# Patient Record
Sex: Female | Born: 1950 | Race: Black or African American | Hispanic: No | Smoking: Former smoker
Health system: Southern US, Community
[De-identification: ages and names within clinical notes are randomized; demographics above are authoritative.]

## PROBLEM LIST (undated history)

## (undated) DIAGNOSIS — R7303 Prediabetes: Secondary | ICD-10-CM

## (undated) DIAGNOSIS — I10 Essential (primary) hypertension: Secondary | ICD-10-CM

## (undated) DIAGNOSIS — E559 Vitamin D deficiency, unspecified: Secondary | ICD-10-CM

## (undated) DIAGNOSIS — E7800 Pure hypercholesterolemia, unspecified: Secondary | ICD-10-CM

## (undated) DIAGNOSIS — F419 Anxiety disorder, unspecified: Secondary | ICD-10-CM

## (undated) DIAGNOSIS — M7989 Other specified soft tissue disorders: Secondary | ICD-10-CM

## (undated) DIAGNOSIS — M2550 Pain in unspecified joint: Secondary | ICD-10-CM

## (undated) DIAGNOSIS — M1990 Unspecified osteoarthritis, unspecified site: Secondary | ICD-10-CM

## (undated) DIAGNOSIS — F32A Depression, unspecified: Secondary | ICD-10-CM

## (undated) HISTORY — PX: BREAST REDUCTION SURGERY: SHX8

## (undated) HISTORY — DX: Prediabetes: R73.03

## (undated) HISTORY — PX: TONSILLECTOMY: SUR1361

## (undated) HISTORY — DX: Pain in unspecified joint: M25.50

## (undated) HISTORY — DX: Depression, unspecified: F32.A

## (undated) HISTORY — DX: Vitamin D deficiency, unspecified: E55.9

## (undated) HISTORY — DX: Other specified soft tissue disorders: M79.89

## (undated) HISTORY — DX: Anxiety disorder, unspecified: F41.9

## (undated) HISTORY — DX: Pure hypercholesterolemia, unspecified: E78.00

---

## 2003-01-22 ENCOUNTER — Other Ambulatory Visit: Admission: RE | Admit: 2003-01-22 | Discharge: 2003-01-22 | Payer: Self-pay | Admitting: *Deleted

## 2003-01-29 ENCOUNTER — Encounter: Admission: RE | Admit: 2003-01-29 | Discharge: 2003-04-29 | Payer: Self-pay | Admitting: Family Medicine

## 2003-02-04 ENCOUNTER — Encounter: Payer: Self-pay | Admitting: Obstetrics and Gynecology

## 2003-02-04 ENCOUNTER — Ambulatory Visit (HOSPITAL_COMMUNITY): Admission: RE | Admit: 2003-02-04 | Discharge: 2003-02-04 | Payer: Self-pay | Admitting: Obstetrics and Gynecology

## 2005-02-24 ENCOUNTER — Emergency Department (HOSPITAL_COMMUNITY): Admission: EM | Admit: 2005-02-24 | Discharge: 2005-02-24 | Payer: Self-pay | Admitting: Emergency Medicine

## 2005-03-22 ENCOUNTER — Other Ambulatory Visit: Admission: RE | Admit: 2005-03-22 | Discharge: 2005-03-22 | Payer: Self-pay | Admitting: Family Medicine

## 2005-04-13 ENCOUNTER — Encounter: Admission: RE | Admit: 2005-04-13 | Discharge: 2005-04-13 | Payer: Self-pay | Admitting: Family Medicine

## 2006-03-27 ENCOUNTER — Other Ambulatory Visit: Admission: RE | Admit: 2006-03-27 | Discharge: 2006-03-27 | Payer: Self-pay | Admitting: Family Medicine

## 2007-07-24 HISTORY — PX: REDUCTION MAMMAPLASTY: SUR839

## 2008-03-10 ENCOUNTER — Ambulatory Visit (HOSPITAL_COMMUNITY): Admission: RE | Admit: 2008-03-10 | Discharge: 2008-03-10 | Payer: Self-pay | Admitting: Internal Medicine

## 2014-01-14 ENCOUNTER — Ambulatory Visit (HOSPITAL_COMMUNITY)
Admission: RE | Admit: 2014-01-14 | Discharge: 2014-01-14 | Disposition: A | Discharge status: Home / Self Care | Payer: BC Managed Care – PPO | Source: Ambulatory Visit | Attending: Anesthesiology | Admitting: Anesthesiology

## 2014-01-14 ENCOUNTER — Encounter (HOSPITAL_COMMUNITY): Payer: Self-pay | Admitting: Pharmacy Technician

## 2014-01-14 ENCOUNTER — Encounter (HOSPITAL_COMMUNITY): Payer: Self-pay

## 2014-01-14 ENCOUNTER — Encounter (HOSPITAL_COMMUNITY)
Admission: RE | Admit: 2014-01-14 | Discharge: 2014-01-14 | Disposition: A | Discharge status: Home / Self Care | Payer: BC Managed Care – PPO | Source: Ambulatory Visit | Attending: Orthopedic Surgery | Admitting: Orthopedic Surgery

## 2014-01-14 DIAGNOSIS — M1610 Unilateral primary osteoarthritis, unspecified hip: Secondary | ICD-10-CM | POA: Diagnosis not present

## 2014-01-14 DIAGNOSIS — Z01810 Encounter for preprocedural cardiovascular examination: Secondary | ICD-10-CM | POA: Diagnosis not present

## 2014-01-14 DIAGNOSIS — M169 Osteoarthritis of hip, unspecified: Secondary | ICD-10-CM | POA: Diagnosis not present

## 2014-01-14 DIAGNOSIS — Z01818 Encounter for other preprocedural examination: Secondary | ICD-10-CM | POA: Diagnosis present

## 2014-01-14 DIAGNOSIS — Z01812 Encounter for preprocedural laboratory examination: Secondary | ICD-10-CM | POA: Insufficient documentation

## 2014-01-14 HISTORY — DX: Essential (primary) hypertension: I10

## 2014-01-14 HISTORY — DX: Unspecified osteoarthritis, unspecified site: M19.90

## 2014-01-14 LAB — BASIC METABOLIC PANEL
BUN: 17 mg/dL (ref 6–23)
CALCIUM: 9.5 mg/dL (ref 8.4–10.5)
CO2: 26 mEq/L (ref 19–32)
Chloride: 99 mEq/L (ref 96–112)
Creatinine, Ser: 0.7 mg/dL (ref 0.50–1.10)
GLUCOSE: 97 mg/dL (ref 70–99)
POTASSIUM: 3.7 meq/L (ref 3.7–5.3)
SODIUM: 137 meq/L (ref 137–147)

## 2014-01-14 LAB — CBC
HCT: 39.4 % (ref 36.0–46.0)
Hemoglobin: 12.5 g/dL (ref 12.0–15.0)
MCH: 27.8 pg (ref 26.0–34.0)
MCHC: 31.7 g/dL (ref 30.0–36.0)
MCV: 87.8 fL (ref 78.0–100.0)
PLATELETS: 323 10*3/uL (ref 150–400)
RBC: 4.49 MIL/uL (ref 3.87–5.11)
RDW: 14 % (ref 11.5–15.5)
WBC: 5.5 10*3/uL (ref 4.0–10.5)

## 2014-01-14 LAB — URINALYSIS, ROUTINE W REFLEX MICROSCOPIC
Bilirubin Urine: NEGATIVE
Glucose, UA: NEGATIVE mg/dL
HGB URINE DIPSTICK: NEGATIVE
KETONES UR: NEGATIVE mg/dL
Leukocytes, UA: NEGATIVE
Nitrite: NEGATIVE
Protein, ur: NEGATIVE mg/dL
SPECIFIC GRAVITY, URINE: 1.011 (ref 1.005–1.030)
Urobilinogen, UA: 0.2 mg/dL (ref 0.0–1.0)
pH: 5 (ref 5.0–8.0)

## 2014-01-14 LAB — SURGICAL PCR SCREEN
MRSA, PCR: INVALID — AB
Staphylococcus aureus: INVALID — AB

## 2014-01-14 LAB — PROTIME-INR
INR: 0.99 (ref 0.00–1.49)
Prothrombin Time: 13.1 seconds (ref 11.6–15.2)

## 2014-01-14 LAB — TYPE AND SCREEN
ABO/RH(D): A POS
ANTIBODY SCREEN: NEGATIVE

## 2014-01-14 LAB — APTT: APTT: 36 s (ref 24–37)

## 2014-01-14 LAB — ABO/RH: ABO/RH(D): A POS

## 2014-01-14 NOTE — Pre-Procedure Instructions (Signed)
EKG AND CXR WERE DONE TODAY - PREOP AT [facility]. 

## 2014-01-14 NOTE — Patient Instructions (Addendum)
YOUR SURGERY IS SCHEDULED AT [facility]  ON:  Monday 6/29  REPORT TO  SHORT STAY CENTER AT:  1:35 PM   PLEASE COME IN THE [facility] MAIN HOSPITAL ENTRANCE AND FOLLOW SIGNS TO SHORT STAY CENTER.  DO NOT EAT  ANYTHING AFTER MIDNIGHT THE NIGHT BEFORE YOUR SURGERY.  NO FOOD, NO CHEWING GUM, NO MINTS, NO CANDIES, NO CHEWING TOBACCO.  YOU MAY HAVE CLEAR LIQUIDS TO DRINK FROM MIDNIGHT UNTIL 10:00 AM DAY OF YOUR SURGERY - LIKE WATER,  CRYSTAL LITE,   GINGER ALE.                                NOTHING TO DRINK AFTER 10:00 AM DAY OF SURGERY.  PLEASE TAKE THE FOLLOWING MEDICATIONS THE AM OF YOUR SURGERY WITH A FEW SIPS OF WATER:  NO MEDICATIONS TO TAKE.    DO NOT BRING VALUABLES, MONEY, CREDIT CARDS.  DO NOT WEAR JEWELRY, MAKE-UP, NAIL POLISH AND NO METAL PINS OR CLIPS IN YOUR HAIR. CONTACT LENS, DENTURES / PARTIALS, GLASSES SHOULD NOT BE WORN TO SURGERY AND IN MOST CASES-HEARING AIDS WILL NEED TO BE REMOVED.  BRING YOUR GLASSES CASE, ANY EQUIPMENT NEEDED FOR YOUR CONTACT LENS. FOR PATIENTS ADMITTED TO THE HOSPITAL--CHECK OUT TIME THE DAY OF DISCHARGE IS 11:00 AM.  ALL INPATIENT ROOMS ARE PRIVATE - WITH BATHROOM, TELEPHONE, TELEVISION AND WIFI INTERNET.                                                    PLEASE READ OVER ANY  FACT SHEETS THAT YOU WERE GIVEN: MRSA INFORMATION, BLOOD TRANSFUSION INFORMATION, INCENTIVE SPIROMETER INFORMATION.  PLEASE BE AWARE THAT YOU MAY NEED ADDITIONAL BLOOD DRAWN DAY OF YOUR SURGERY  _______________________________________________________________________   [facility] - Preparing for Surgery Before surgery, you can play an important role.  Because skin is not sterile, your skin needs to be as free of germs as possible.  You can reduce the number of germs on your skin by washing with CHG (chlorahexidine gluconate) soap before surgery.  CHG is an antiseptic cleaner which kills germs and bonds with the skin to continue killing germs even after  washing. Please DO NOT use if you have an allergy to CHG or antibacterial soaps.  If your skin becomes reddened/irritated stop using the CHG and inform your nurse when you arrive at Short Stay. Do not shave (including legs and underarms) for at least 48 hours prior to the first CHG shower.  You may shave your face/neck. Please follow these instructions carefully:  1.  Shower with CHG Soap the night before surgery and the  morning of Surgery.  2.  If you choose to wash your hair, wash your hair first as usual with your  normal  shampoo.  3.  After you shampoo, rinse your hair and body thoroughly to remove the  shampoo.                           4.  Use CHG as you would any other liquid soap.  You can apply chg directly  to the skin and wash                       Gently  with a scrungie or clean washcloth.  5.  Apply the CHG Soap to your body ONLY FROM THE NECK DOWN.   Do not use on face/ open                           Wound or open sores. Avoid contact with eyes, ears mouth and genitals (private parts).                       Wash face,  Genitals (private parts) with your normal soap.             6.  Wash thoroughly, paying special attention to the area where your surgery  will be performed.  7.  Thoroughly rinse your body with warm water from the neck down.  8.  DO NOT shower/wash with your normal soap after using and rinsing off  the CHG Soap.                9.  Pat yourself dry with a clean towel.            10.  Wear clean pajamas.            11.  Place clean sheets on your bed the night of your first shower and do not  sleep with pets. Day of Surgery : Do not apply any lotions/deodorants the morning of surgery.  Please wear clean clothes to the hospital/surgery center.  FAILURE TO FOLLOW THESE INSTRUCTIONS MAY RESULT IN THE CANCELLATION OF YOUR SURGERY PATIENT SIGNATURE_________________________________  NURSE  SIGNATURE__________________________________  ________________________________________________________________________   Rogelia MireIncentive Spirometer  An incentive spirometer is a tool that can help keep your lungs clear and active. This tool measures how well you are filling your lungs with each breath. Taking long deep breaths may help reverse or decrease the chance of developing breathing (pulmonary) problems (especially infection) following:  A long period of time when you are unable to move or be active. BEFORE THE PROCEDURE   If the spirometer includes an indicator to show your best effort, your nurse or respiratory therapist will set it to a desired goal.  If possible, sit up straight or lean slightly forward. Try not to slouch.  Hold the incentive spirometer in an upright position. INSTRUCTIONS FOR USE  1. Sit on the edge of your bed if possible, or sit up as far as you can in bed or on a chair. 2. Hold the incentive spirometer in an upright position. 3. Breathe out normally. 4. Place the mouthpiece in your mouth and seal your lips tightly around it. 5. Breathe in slowly and as deeply as possible, raising the piston or the ball toward the top of the column. 6. Hold your breath for 3-5 seconds or for as long as possible. Allow the piston or ball to fall to the bottom of the column. 7. Remove the mouthpiece from your mouth and breathe out normally. 8. Rest for a few seconds and repeat Steps 1 through 7 at least 10 times every 1-2 hours when you are awake. Take your time and take a few normal breaths between deep breaths. 9. The spirometer may include an indicator to show your best effort. Use the indicator as a goal to work toward during each repetition. 10. After each set of 10 deep breaths, practice coughing to be sure your lungs are clear. If you have an incision (the cut made at the time of surgery), support  your incision when coughing by placing a pillow or rolled up towels firmly  against it. Once you are able to get out of bed, walk around indoors and cough well. You may stop using the incentive spirometer when instructed by your caregiver.  RISKS AND COMPLICATIONS  Take your time so you do not get dizzy or light-headed.  If you are in pain, you may need to take or ask for pain medication before doing incentive spirometry. It is harder to take a deep breath if you are having pain. AFTER USE  Rest and breathe slowly and easily.  It can be helpful to keep track of a log of your progress. Your caregiver can provide you with a simple table to help with this. If you are using the spirometer at home, follow these instructions: SEEK MEDICAL CARE IF:   You are having difficultly using the spirometer.  You have trouble using the spirometer as often as instructed.  Your pain medication is not giving enough relief while using the spirometer.  You develop fever of 100.5 F (38.1 C) or higher. SEEK IMMEDIATE MEDICAL CARE IF:   You cough up bloody sputum that had not been present before.  You develop fever of 102 F (38.9 C) or greater.  You develop worsening pain at or near the incision site. MAKE SURE YOU:   Understand these instructions.  Will watch your condition.  Will get help right away if you are not doing well or get worse. Document Released: 11/19/2006 Document Revised: 10/01/2011 Document Reviewed: 01/20/2007 Exit[employer] Patient Information 2014 [employer], [place].   ________________________________________________________________________  WHAT IS A BLOOD TRANSFUSION? Blood Transfusion Information  A transfusion is the replacement of blood or some of its parts. Blood is made up of multiple cells which provide different functions.  Red blood cells carry oxygen and are used for blood loss replacement.  White blood cells fight against infection.  Platelets control bleeding.  Plasma helps clot blood.  Other blood products are available for  specialized needs, such as hemophilia or other clotting disorders. BEFORE THE TRANSFUSION  Who gives blood for transfusions?   Healthy volunteers who are fully evaluated to make sure their blood is safe. This is blood bank blood. Transfusion therapy is the safest it has ever been in the practice of medicine. Before blood is taken from a donor, a complete history is taken to make sure that person has no history of diseases nor engages in risky social behavior (examples are intravenous drug use or sexual activity with multiple partners). The donor's travel history is screened to minimize risk of transmitting infections, such as malaria. The donated blood is tested for signs of infectious diseases, such as HIV and hepatitis. The blood is then tested to be sure it is compatible with you in order to minimize the chance of a transfusion reaction. If you or a relative donates blood, this is often done in anticipation of surgery and is not appropriate for emergency situations. It takes many days to process the donated blood. RISKS AND COMPLICATIONS Although transfusion therapy is very safe and saves many lives, the main dangers of transfusion include:   Getting an infectious disease.  Developing a transfusion reaction. This is an allergic reaction to something in the blood you were given. Every precaution is taken to prevent this. The decision to have a blood transfusion has been considered carefully by your caregiver before blood is given. Blood is not given unless the benefits outweigh the risks. AFTER THE TRANSFUSION  Right after receiving a blood transfusion, you will usually feel much better and more energetic. This is especially true if your red blood cells have gotten low (anemic). The transfusion raises the level of the red blood cells which carry oxygen, and this usually causes an energy increase.  The nurse administering the transfusion will monitor you carefully for complications. HOME CARE  INSTRUCTIONS  No special instructions are needed after a transfusion. You may find your energy is better. Speak with your caregiver about any limitations on activity for underlying diseases you may have. SEEK MEDICAL CARE IF:   Your condition is not improving after your transfusion.  You develop redness or irritation at the intravenous (IV) site. SEEK IMMEDIATE MEDICAL CARE IF:  Any of the following symptoms occur over the next 12 hours:  Shaking chills.  You have a temperature by mouth above 102 F (38.9 C), not controlled by medicine.  Chest, back, or muscle pain.  People around you feel you are not acting correctly or are confused.  Shortness of breath or difficulty breathing.  Dizziness and fainting.  You get a rash or develop hives.  You have a decrease in urine output.  Your urine turns a dark color or changes to pink, red, or brown. Any of the following symptoms occur over the next 10 days:  You have a temperature by mouth above 102 F (38.9 C), not controlled by medicine.  Shortness of breath.  Weakness after normal activity.  The white part of the eye turns yellow (jaundice).  You have a decrease in the amount of urine or are urinating less often.  Your urine turns a dark color or changes to pink, red, or brown. Document Released: 07/06/2000 Document Revised: 10/01/2011 Document Reviewed: 02/23/2008 [facility] Patient Information 2014 [place], [place].  _______________________________________________________________________

## 2014-01-16 LAB — MRSA CULTURE

## 2014-01-16 NOTE — H&P (Signed)
TOTAL KNEE ADMISSION H&P  Patient is being admitted for left total knee arthroplasty.  Subjective:  Chief Complaint:     Left knee OA / pain.  HPI: Sherry Gutierrez, 63 y.o. female, has a history of pain and functional disability in the left knee due to arthritis and has failed non-surgical conservative treatments for greater than 12 weeks to include NSAID's and/or analgesics and activity modification.  Onset of symptoms was gradual, starting 3+ years ago with gradually worsening course since that time. The patient noted no past surgery on the left knee(s).  Patient currently rates pain in the left knee(s) at 10 out of 10 with activity. Patient has worsening of pain with activity and weight bearing, pain that interferes with activities of daily living, pain with passive range of motion, crepitus and joint swelling.  Patient has evidence of periarticular osteophytes and joint space narrowing by imaging studies.  There is no active infection.  Risks, benefits and expectations were discussed with the patient.  Risks including but not limited to the risk of anesthesia, blood clots, nerve damage, blood vessel damage, failure of the prosthesis, infection and up to and including death.  Patient understand the risks, benefits and expectations and wishes to proceed with surgery.   PCP: No primary Rayvin Abid on file.  D/C Plans:      Home with HHPT  Post-op Meds:       No Rx given  Tranexamic Acid:      Is to be given  Decadron:      Is to be given  FYI:     ASA post-op  Norco post-op    Past Medical History  Diagnosis Date  . Hypertension   . Arthritis     OA LEFT KNEE    Past Surgical History  Procedure Laterality Date  . Tonsillectomy      AGE 9    No prescriptions prior to admission   No Known Allergies   History  Substance Use Topics  . Smoking status: Former Games developermoker  . Smokeless tobacco: Never Used  . Alcohol Use: Yes     Comment: QUIT SMOKING 1982  OCCAS GLASS OF WINE    No  family history on file.   Review of Systems  Constitutional: Negative.   HENT: Negative.   Eyes: Negative.   Respiratory: Negative.   Cardiovascular: Negative.   Gastrointestinal: Negative.   Genitourinary: Negative.   Musculoskeletal: Positive for joint pain.  Skin: Negative.   Neurological: Negative.   Endo/Heme/Allergies: Negative.   Psychiatric/Behavioral: Negative.     Objective:  Physical Exam  Constitutional: She is oriented to person, place, and time. She appears well-developed and well-nourished.  HENT:  Head: Normocephalic and atraumatic.  Mouth/Throat: Oropharynx is clear and moist.  Eyes: Pupils are equal, round, and reactive to light.  Neck: Neck supple. No JVD present. No tracheal deviation present. No thyromegaly present.  Cardiovascular: Normal rate, regular rhythm, normal heart sounds and intact distal pulses.   Respiratory: Effort normal and breath sounds normal. No respiratory distress. She has no wheezes.  GI: Soft. There is no tenderness. There is no guarding.  Musculoskeletal:       Left knee: She exhibits decreased range of motion, swelling and bony tenderness. She exhibits no ecchymosis, no deformity, no laceration and no erythema. Tenderness found.  Lymphadenopathy:    She has no cervical adenopathy.  Neurological: She is alert and oriented to person, place, and time.  Skin: Skin is warm and dry.  Psychiatric:  She has a normal mood and affect.     Imaging Review Plain radiographs demonstrate severe degenerative joint disease of the left knee(s). The overall alignment is neutral. The bone quality appears to be good for age and reported activity level.  Assessment/Plan:  End stage arthritis, left knee   The patient history, physical examination, clinical judgment of the Shakena Callari and imaging studies are consistent with end stage degenerative joint disease of the left knee(s) and total knee arthroplasty is deemed medically necessary. The treatment  options including medical management, injection therapy arthroscopy and arthroplasty were discussed at length. The risks and benefits of total knee arthroplasty were presented and reviewed. The risks due to aseptic loosening, infection, stiffness, patella tracking problems, thromboembolic complications and other imponderables were discussed. The patient acknowledged the explanation, agreed to proceed with the plan and consent was signed. Patient is being admitted for inpatient treatment for surgery, pain control, PT, OT, prophylactic antibiotics, VTE prophylaxis, progressive ambulation and ADL's and discharge planning. The patient is planning to be discharged home with home health services.      Anastasio AuerbachMatthew S. Babish   PA-C  01/16/2014, 6:44 PM

## 2014-01-18 ENCOUNTER — Encounter (HOSPITAL_COMMUNITY)
Admission: RE | Disposition: A | Discharge status: Home Health | Payer: Self-pay | Source: Ambulatory Visit | Attending: Orthopedic Surgery

## 2014-01-18 ENCOUNTER — Inpatient Hospital Stay (HOSPITAL_COMMUNITY): Payer: BC Managed Care – PPO | Admitting: Anesthesiology

## 2014-01-18 ENCOUNTER — Inpatient Hospital Stay (HOSPITAL_COMMUNITY)
Admission: RE | Admit: 2014-01-18 | Discharge: 2014-01-21 | DRG: 470 | Disposition: A | Discharge status: Home Health | Payer: BC Managed Care – PPO | Source: Ambulatory Visit | Attending: Orthopedic Surgery | Admitting: Orthopedic Surgery

## 2014-01-18 ENCOUNTER — Encounter (HOSPITAL_COMMUNITY): Payer: BC Managed Care – PPO | Admitting: Anesthesiology

## 2014-01-18 ENCOUNTER — Encounter (HOSPITAL_COMMUNITY): Payer: Self-pay | Admitting: *Deleted

## 2014-01-18 DIAGNOSIS — Z6838 Body mass index (BMI) 38.0-38.9, adult: Secondary | ICD-10-CM

## 2014-01-18 DIAGNOSIS — D500 Iron deficiency anemia secondary to blood loss (chronic): Secondary | ICD-10-CM | POA: Diagnosis not present

## 2014-01-18 DIAGNOSIS — E669 Obesity, unspecified: Secondary | ICD-10-CM | POA: Diagnosis present

## 2014-01-18 DIAGNOSIS — Z96652 Presence of left artificial knee joint: Secondary | ICD-10-CM

## 2014-01-18 DIAGNOSIS — I10 Essential (primary) hypertension: Secondary | ICD-10-CM | POA: Diagnosis present

## 2014-01-18 DIAGNOSIS — Z87891 Personal history of nicotine dependence: Secondary | ICD-10-CM

## 2014-01-18 DIAGNOSIS — M6580 Other synovitis and tenosynovitis, unspecified site: Secondary | ICD-10-CM | POA: Diagnosis present

## 2014-01-18 DIAGNOSIS — M1710 Unilateral primary osteoarthritis, unspecified knee: Principal | ICD-10-CM | POA: Diagnosis present

## 2014-01-18 DIAGNOSIS — Z96659 Presence of unspecified artificial knee joint: Secondary | ICD-10-CM

## 2014-01-18 DIAGNOSIS — D62 Acute posthemorrhagic anemia: Secondary | ICD-10-CM | POA: Diagnosis not present

## 2014-01-18 HISTORY — PX: TOTAL KNEE ARTHROPLASTY: SHX125

## 2014-01-18 SURGERY — ARTHROPLASTY, KNEE, TOTAL
Anesthesia: Spinal | Site: Knee | Laterality: Left

## 2014-01-18 MED ORDER — EPHEDRINE SULFATE 50 MG/ML IJ SOLN
INTRAMUSCULAR | Status: DC | PRN
  Administered 2014-01-18 (×2): 7.5 mg via INTRAVENOUS

## 2014-01-18 MED ORDER — MIDAZOLAM HCL 5 MG/5ML IJ SOLN
INTRAMUSCULAR | Status: DC | PRN
  Administered 2014-01-18: 2 mg via INTRAVENOUS

## 2014-01-18 MED ORDER — ASPIRIN EC 325 MG PO TBEC
325.0000 mg | DELAYED_RELEASE_TABLET | Freq: Two times a day (BID) | ORAL | Status: DC
  Administered 2014-01-19 – 2014-01-21 (×5): 325 mg via ORAL
  Filled 2014-01-18 (×7): qty 1

## 2014-01-18 MED ORDER — POLYETHYLENE GLYCOL 3350 17 G PO PACK
17.0000 g | PACK | Freq: Two times a day (BID) | ORAL | Status: DC
  Administered 2014-01-18 – 2014-01-21 (×3): 17 g via ORAL

## 2014-01-18 MED ORDER — MIDAZOLAM HCL 2 MG/2ML IJ SOLN
INTRAMUSCULAR | Status: AC
  Filled 2014-01-18: qty 2

## 2014-01-18 MED ORDER — HYDROMORPHONE HCL PF 1 MG/ML IJ SOLN: 0.2500 mg | INTRAMUSCULAR | Status: DC | PRN

## 2014-01-18 MED ORDER — CEFAZOLIN SODIUM-DEXTROSE 2-3 GM-% IV SOLR
2.0000 g | INTRAVENOUS | Status: AC
  Administered 2014-01-18: 2 g via INTRAVENOUS

## 2014-01-18 MED ORDER — BISACODYL 10 MG RE SUPP: 10.0000 mg | Freq: Every day | RECTAL | Status: DC | PRN

## 2014-01-18 MED ORDER — LACTATED RINGERS IV SOLN
INTRAVENOUS | Status: DC
  Administered 2014-01-18: 17:00:00 via INTRAVENOUS
  Administered 2014-01-18: 1000 mL via INTRAVENOUS

## 2014-01-18 MED ORDER — METHOCARBAMOL 1000 MG/10ML IJ SOLN
500.0000 mg | Freq: Four times a day (QID) | INTRAVENOUS | Status: DC | PRN
  Filled 2014-01-18: qty 5

## 2014-01-18 MED ORDER — METOCLOPRAMIDE HCL 5 MG/ML IJ SOLN: 5.0000 mg | Freq: Three times a day (TID) | INTRAMUSCULAR | Status: DC | PRN

## 2014-01-18 MED ORDER — CEFAZOLIN SODIUM-DEXTROSE 2-3 GM-% IV SOLR
INTRAVENOUS | Status: AC
  Filled 2014-01-18: qty 50

## 2014-01-18 MED ORDER — ONDANSETRON HCL 4 MG/2ML IJ SOLN
4.0000 mg | Freq: Four times a day (QID) | INTRAMUSCULAR | Status: DC | PRN
  Administered 2014-01-19: 4 mg via INTRAVENOUS
  Filled 2014-01-18: qty 2

## 2014-01-18 MED ORDER — METHOCARBAMOL 500 MG PO TABS
500.0000 mg | ORAL_TABLET | Freq: Four times a day (QID) | ORAL | Status: DC | PRN
  Filled 2014-01-18: qty 1

## 2014-01-18 MED ORDER — FERROUS SULFATE 325 (65 FE) MG PO TABS
325.0000 mg | ORAL_TABLET | Freq: Three times a day (TID) | ORAL | Status: DC
  Administered 2014-01-19 – 2014-01-21 (×4): 325 mg via ORAL
  Filled 2014-01-18 (×10): qty 1

## 2014-01-18 MED ORDER — HYDROCODONE-ACETAMINOPHEN 7.5-325 MG PO TABS
1.0000 | ORAL_TABLET | ORAL | Status: DC
  Administered 2014-01-18: 1 via ORAL
  Administered 2014-01-19 (×2): 2 via ORAL
  Administered 2014-01-19: 1 via ORAL
  Administered 2014-01-19 – 2014-01-21 (×8): 2 via ORAL
  Filled 2014-01-18: qty 1
  Filled 2014-01-18: qty 2
  Filled 2014-01-18: qty 1
  Filled 2014-01-18 (×3): qty 2
  Filled 2014-01-18: qty 1
  Filled 2014-01-18 (×4): qty 2
  Filled 2014-01-18: qty 1
  Filled 2014-01-18: qty 2
  Filled 2014-01-18: qty 1

## 2014-01-18 MED ORDER — BUPIVACAINE LIPOSOME 1.3 % IJ SUSP
20.0000 mL | Freq: Once | INTRAMUSCULAR | Status: AC
  Administered 2014-01-18: 20 mL
  Filled 2014-01-18: qty 20

## 2014-01-18 MED ORDER — MENTHOL 3 MG MT LOZG
1.0000 | LOZENGE | OROMUCOSAL | Status: DC | PRN
  Filled 2014-01-18: qty 9

## 2014-01-18 MED ORDER — HYDROMORPHONE HCL PF 1 MG/ML IJ SOLN
0.5000 mg | INTRAMUSCULAR | Status: DC | PRN
  Administered 2014-01-18: 0.5 mg via INTRAVENOUS
  Filled 2014-01-18: qty 1

## 2014-01-18 MED ORDER — MAGNESIUM CITRATE PO SOLN: 1.0000 | Freq: Once | ORAL | Status: AC | PRN

## 2014-01-18 MED ORDER — CELECOXIB 200 MG PO CAPS
200.0000 mg | ORAL_CAPSULE | Freq: Two times a day (BID) | ORAL | Status: DC
  Administered 2014-01-18 – 2014-01-21 (×6): 200 mg via ORAL
  Filled 2014-01-18 (×7): qty 1

## 2014-01-18 MED ORDER — KETOROLAC TROMETHAMINE 30 MG/ML IJ SOLN
INTRAMUSCULAR | Status: DC | PRN
  Administered 2014-01-18: 30 mg via INTRAVENOUS

## 2014-01-18 MED ORDER — ONDANSETRON HCL 4 MG PO TABS
4.0000 mg | ORAL_TABLET | Freq: Four times a day (QID) | ORAL | Status: DC | PRN
  Administered 2014-01-19: 4 mg via ORAL
  Filled 2014-01-18: qty 1

## 2014-01-18 MED ORDER — LACTATED RINGERS IV SOLN: INTRAVENOUS | Status: DC

## 2014-01-18 MED ORDER — TRANEXAMIC ACID 100 MG/ML IV SOLN
1000.0000 mg | Freq: Once | INTRAVENOUS | Status: AC
  Administered 2014-01-18: 1000 mg via INTRAVENOUS
  Filled 2014-01-18: qty 10

## 2014-01-18 MED ORDER — DEXAMETHASONE SODIUM PHOSPHATE 10 MG/ML IJ SOLN
INTRAMUSCULAR | Status: AC
  Filled 2014-01-18: qty 1

## 2014-01-18 MED ORDER — FENTANYL CITRATE 0.05 MG/ML IJ SOLN
INTRAMUSCULAR | Status: DC | PRN
  Administered 2014-01-18 (×2): 50 ug via INTRAVENOUS

## 2014-01-18 MED ORDER — BUPIVACAINE-EPINEPHRINE (PF) 0.25% -1:200000 IJ SOLN
INTRAMUSCULAR | Status: DC | PRN
  Administered 2014-01-18: 30 mL via PERINEURAL

## 2014-01-18 MED ORDER — PROPOFOL 10 MG/ML IV BOLUS
INTRAVENOUS | Status: AC
  Filled 2014-01-18: qty 20

## 2014-01-18 MED ORDER — KETOROLAC TROMETHAMINE 30 MG/ML IJ SOLN
INTRAMUSCULAR | Status: AC
  Filled 2014-01-18: qty 1

## 2014-01-18 MED ORDER — DOCUSATE SODIUM 100 MG PO CAPS
100.0000 mg | ORAL_CAPSULE | Freq: Two times a day (BID) | ORAL | Status: DC
  Administered 2014-01-18 – 2014-01-21 (×6): 100 mg via ORAL

## 2014-01-18 MED ORDER — MEPERIDINE HCL 50 MG/ML IJ SOLN: 6.2500 mg | INTRAMUSCULAR | Status: DC | PRN

## 2014-01-18 MED ORDER — ONDANSETRON HCL 4 MG/2ML IJ SOLN
INTRAMUSCULAR | Status: AC
  Filled 2014-01-18: qty 2

## 2014-01-18 MED ORDER — DIPHENHYDRAMINE HCL 25 MG PO CAPS: 25.0000 mg | ORAL_CAPSULE | Freq: Four times a day (QID) | ORAL | Status: DC | PRN

## 2014-01-18 MED ORDER — ALUM & MAG HYDROXIDE-SIMETH 200-200-20 MG/5ML PO SUSP: 30.0000 mL | ORAL | Status: DC | PRN

## 2014-01-18 MED ORDER — PROPOFOL INFUSION 10 MG/ML OPTIME
INTRAVENOUS | Status: DC | PRN
  Administered 2014-01-18: 100 ug/kg/min via INTRAVENOUS

## 2014-01-18 MED ORDER — CHLORHEXIDINE GLUCONATE 4 % EX LIQD: 60.0000 mL | Freq: Once | CUTANEOUS | Status: DC

## 2014-01-18 MED ORDER — ONDANSETRON HCL 4 MG/2ML IJ SOLN
INTRAMUSCULAR | Status: DC | PRN
  Administered 2014-01-18: 4 mg via INTRAVENOUS

## 2014-01-18 MED ORDER — BUPIVACAINE IN DEXTROSE 0.75-8.25 % IT SOLN
INTRATHECAL | Status: DC | PRN
  Administered 2014-01-18: 2 mL via INTRATHECAL

## 2014-01-18 MED ORDER — METOCLOPRAMIDE HCL 10 MG PO TABS: 5.0000 mg | ORAL_TABLET | Freq: Three times a day (TID) | ORAL | Status: DC | PRN

## 2014-01-18 MED ORDER — PHENOL 1.4 % MT LIQD
1.0000 | OROMUCOSAL | Status: DC | PRN
  Filled 2014-01-18: qty 177

## 2014-01-18 MED ORDER — PROPOFOL 10 MG/ML IV BOLUS
INTRAVENOUS | Status: DC | PRN
  Administered 2014-01-18: 20 mg via INTRAVENOUS
  Administered 2014-01-18: 10 mg via INTRAVENOUS

## 2014-01-18 MED ORDER — PROMETHAZINE HCL 25 MG/ML IJ SOLN: 6.2500 mg | INTRAMUSCULAR | Status: DC | PRN

## 2014-01-18 MED ORDER — SODIUM CHLORIDE 0.9 % IV SOLN
INTRAVENOUS | Status: DC
  Administered 2014-01-18: 23:00:00 via INTRAVENOUS
  Filled 2014-01-18 (×9): qty 1000

## 2014-01-18 MED ORDER — SODIUM CHLORIDE 0.9 % IJ SOLN
INTRAMUSCULAR | Status: AC
  Filled 2014-01-18: qty 10

## 2014-01-18 MED ORDER — PHENYLEPHRINE HCL 10 MG/ML IJ SOLN
INTRAMUSCULAR | Status: DC | PRN
  Administered 2014-01-18 (×4): 80 ug via INTRAVENOUS

## 2014-01-18 MED ORDER — BUPIVACAINE-EPINEPHRINE (PF) 0.25% -1:200000 IJ SOLN
INTRAMUSCULAR | Status: AC
  Filled 2014-01-18: qty 30

## 2014-01-18 MED ORDER — CEFAZOLIN SODIUM-DEXTROSE 2-3 GM-% IV SOLR
2.0000 g | Freq: Four times a day (QID) | INTRAVENOUS | Status: AC
  Administered 2014-01-18 – 2014-01-19 (×2): 2 g via INTRAVENOUS
  Filled 2014-01-18 (×2): qty 50

## 2014-01-18 MED ORDER — DEXAMETHASONE SODIUM PHOSPHATE 10 MG/ML IJ SOLN
10.0000 mg | Freq: Once | INTRAMUSCULAR | Status: AC
  Administered 2014-01-18: 10 mg via INTRAVENOUS

## 2014-01-18 MED ORDER — DEXAMETHASONE SODIUM PHOSPHATE 10 MG/ML IJ SOLN: 10.0000 mg | Freq: Once | INTRAMUSCULAR | Status: DC

## 2014-01-18 MED ORDER — FENTANYL CITRATE 0.05 MG/ML IJ SOLN
INTRAMUSCULAR | Status: AC
  Filled 2014-01-18: qty 2

## 2014-01-18 SURGICAL SUPPLY — 53 items
BAG ZIPLOCK 12X15 (MISCELLANEOUS) IMPLANT
BANDAGE ELASTIC 6 VELCRO ST LF (GAUZE/BANDAGES/DRESSINGS) ×2 IMPLANT
BANDAGE ESMARK 6X9 LF (GAUZE/BANDAGES/DRESSINGS) ×1 IMPLANT
BLADE SAW SGTL 13.0X1.19X90.0M (BLADE) ×2 IMPLANT
BNDG ESMARK 6X9 LF (GAUZE/BANDAGES/DRESSINGS) ×2
BOWL SMART MIX CTS (DISPOSABLE) ×2 IMPLANT
CAP KNEE ATTUNE RP ×2 IMPLANT
CEMENT HV SMART SET (Cement) ×4 IMPLANT
CUFF TOURN SGL QUICK 34 (TOURNIQUET CUFF) ×1
CUFF TRNQT CYL 34X4X40X1 (TOURNIQUET CUFF) ×1 IMPLANT
DERMABOND ADVANCED (GAUZE/BANDAGES/DRESSINGS) ×1
DERMABOND ADVANCED .7 DNX12 (GAUZE/BANDAGES/DRESSINGS) ×1 IMPLANT
DRAPE EXTREMITY T 121X128X90 (DRAPE) ×2 IMPLANT
DRAPE POUCH INSTRU U-SHP 10X18 (DRAPES) ×2 IMPLANT
DRAPE U-SHAPE 47X51 STRL (DRAPES) ×2 IMPLANT
DRSG AQUACEL AG ADV 3.5X10 (GAUZE/BANDAGES/DRESSINGS) ×2 IMPLANT
DRSG TEGADERM 4X4.75 (GAUZE/BANDAGES/DRESSINGS) IMPLANT
DURAPREP 26ML APPLICATOR (WOUND CARE) ×4 IMPLANT
ELECT REM PT RETURN 9FT ADLT (ELECTROSURGICAL) ×2
ELECTRODE REM PT RTRN 9FT ADLT (ELECTROSURGICAL) ×1 IMPLANT
FACESHIELD WRAPAROUND (MASK) ×10 IMPLANT
GAUZE SPONGE 2X2 8PLY STRL LF (GAUZE/BANDAGES/DRESSINGS) IMPLANT
GLOVE BIOGEL PI IND STRL 7.5 (GLOVE) ×1 IMPLANT
GLOVE BIOGEL PI IND STRL 8 (GLOVE) ×1 IMPLANT
GLOVE BIOGEL PI INDICATOR 7.5 (GLOVE) ×1
GLOVE BIOGEL PI INDICATOR 8 (GLOVE) ×1
GLOVE ECLIPSE 8.0 STRL XLNG CF (GLOVE) ×2 IMPLANT
GLOVE ORTHO TXT STRL SZ7.5 (GLOVE) ×4 IMPLANT
GOWN SPEC L3 XXLG W/TWL (GOWN DISPOSABLE) ×2 IMPLANT
GOWN STRL REUS W/TWL LRG LVL3 (GOWN DISPOSABLE) ×2 IMPLANT
HANDPIECE INTERPULSE COAX TIP (DISPOSABLE) ×1
KIT BASIN OR (CUSTOM PROCEDURE TRAY) ×2 IMPLANT
MANIFOLD NEPTUNE II (INSTRUMENTS) ×4 IMPLANT
NDL SAFETY ECLIPSE 18X1.5 (NEEDLE) ×1 IMPLANT
NEEDLE HYPO 18GX1.5 SHARP (NEEDLE) ×1
PACK ICE MAXI GEL EZY WRAP (MISCELLANEOUS) ×2 IMPLANT
PACK TOTAL JOINT (CUSTOM PROCEDURE TRAY) ×2 IMPLANT
POSITIONER SURGICAL ARM (MISCELLANEOUS) ×2 IMPLANT
SET HNDPC FAN SPRY TIP SCT (DISPOSABLE) ×1 IMPLANT
SET PAD KNEE POSITIONER (MISCELLANEOUS) ×2 IMPLANT
SPONGE GAUZE 2X2 STER 10/PKG (GAUZE/BANDAGES/DRESSINGS)
SUCTION FRAZIER 12FR DISP (SUCTIONS) ×2 IMPLANT
SUT MNCRL AB 4-0 PS2 18 (SUTURE) ×2 IMPLANT
SUT VIC AB 1 CT1 36 (SUTURE) ×2 IMPLANT
SUT VIC AB 2-0 CT1 27 (SUTURE) ×3
SUT VIC AB 2-0 CT1 TAPERPNT 27 (SUTURE) ×3 IMPLANT
SUT VLOC 180 0 24IN GS25 (SUTURE) ×2 IMPLANT
SYRINGE 60CC LL (MISCELLANEOUS) ×2 IMPLANT
TOWEL OR 17X26 10 PK STRL BLUE (TOWEL DISPOSABLE) ×2 IMPLANT
TOWEL OR NON WOVEN STRL DISP B (DISPOSABLE) ×2 IMPLANT
TRAY FOLEY CATH 14FRSI W/METER (CATHETERS) ×2 IMPLANT
WATER STERILE IRR 1500ML POUR (IV SOLUTION) ×2 IMPLANT
WRAP KNEE MAXI GEL POST OP (GAUZE/BANDAGES/DRESSINGS) ×2 IMPLANT

## 2014-01-18 NOTE — Transfer of Care (Signed)
Immediate Anesthesia Transfer of Care Note  Patient: Sherry Gutierrez  Procedure(s) Performed: Procedure(s) (LRB): LEFT TOTAL KNEE ARTHROPLASTY (Left)  Patient Location: PACU  Anesthesia Type: Spinal  Level of Consciousness: sedated, patient cooperative and responds to stimulation  Airway & Oxygen Therapy: Patient Spontanous Breathing and Patient connected to face mask oxgen  Post-op Assessment: Report given to PACU RN and Post -op Vital signs reviewed and stable  Post vital signs: Reviewed and stable  Complications: No apparent anesthesia complications

## 2014-01-18 NOTE — Anesthesia Preprocedure Evaluation (Signed)
Anesthesia Evaluation  Patient identified by MRN, date of birth, ID band Patient awake    Reviewed: Allergy & Precautions, H&P , NPO status , Patient's Chart, lab work & pertinent test results  Airway Mallampati: II  TM Distance: >3 FB Neck ROM: Full    Dental no notable dental hx.    Pulmonary neg pulmonary ROS, former smoker,  breath sounds clear to auscultation  Pulmonary exam normal       Cardiovascular hypertension, Pt. on medications negative cardio ROS  Rhythm:Regular Rate:Normal     Neuro/Psych negative neurological ROS  negative psych ROS   GI/Hepatic negative GI ROS, Neg liver ROS,   Endo/Other  negative endocrine ROS  Renal/GU negative Renal ROS  negative genitourinary   Musculoskeletal negative musculoskeletal ROS (+)   Abdominal   Peds negative pediatric ROS (+)  Hematology negative hematology ROS (+)   Anesthesia Other Findings   Reproductive/Obstetrics negative OB ROS                             Anesthesia Physical Anesthesia Plan  ASA: II  Anesthesia Plan: Spinal   Post-op Pain Management:    Induction:   Airway Management Planned: Simple Face Mask  Additional Equipment:   Intra-op Plan:   Post-operative Plan:   Informed Consent: I have reviewed the patients History and Physical, chart, labs and discussed the procedure including the risks, benefits and alternatives for the proposed anesthesia with the patient or authorized representative who has indicated his/her understanding and acceptance.   Dental advisory given  Plan Discussed with: CRNA  Anesthesia Plan Comments:         Anesthesia Quick Evaluation  

## 2014-01-18 NOTE — Interval H&P Note (Signed)
History and Physical Interval Note:  01/18/2014 2:54 PM  Sherry Gutierrez  has presented today for surgery, with the diagnosis of left knee osteoarthritis  The various methods of treatment have been discussed with the patient and family. After consideration of risks, benefits and other options for treatment, the patient has consented to  Procedure(s): LEFT TOTAL KNEE ARTHROPLASTY (Left) as a surgical intervention .  [name] history has been reviewed, patient examined, no change in status, stable for surgery.  I have reviewed the patient's chart and labs.  Questions were answered to the patient's satisfaction.     Shelda PalLIN,MATTHEW D

## 2014-01-18 NOTE — Anesthesia Procedure Notes (Signed)
Spinal  Patient location during procedure: OR Start time: 01/18/2014 3:50 PM End time: 01/18/2014 3:56 PM Staffing CRNA/Resident: Lajuana Carry E Performed by: resident/CRNA  Preanesthetic Checklist Completed: patient identified, site marked, surgical consent, pre-op evaluation, timeout performed, IV checked, risks and benefits discussed and monitors and equipment checked Spinal Block Patient position: sitting Prep: Betadine Patient monitoring: heart rate, continuous pulse ox and blood pressure Approach: midline Location: L3-4 Injection technique: single-shot Needle Needle type: Spinocan  Needle gauge: 22 G Needle length: 9 cm Additional Notes Time out prior to procedure, Kit expiration checked, Sitting, sterile prep and drape. Clear CSF, neg heme, neg paresthesia, tol well, return to supine

## 2014-01-18 NOTE — Op Note (Signed)
NAME:  Sherry DecemberMarilyn G Villavicencio                      MEDICAL RECORD NO.:  [id number]                             [facility]      PHYSICIAN:  Madlyn FrankelMatthew D. Charlann Boxerlin, M.D.  DATE OF BIRTH:  July 24, 1950      DATE OF PROCEDURE:  01/18/2014                                     OPERATIVE REPORT         PREOPERATIVE DIAGNOSIS:  Left knee osteoarthritis.      POSTOPERATIVE DIAGNOSIS:  Left knee osteoarthritis.      FINDINGS:  The patient was noted to have complete loss of cartilage and   bone-on-bone arthritis with associated osteophytes in the medial and patellofemoral compartments of   the knee with a significant synovitis and associated effusion.      PROCEDURE:  Left total knee replacement.      COMPONENTS USED:  DePuy Attune rotating platform posterior stabilized knee   system, a size 5 narrow femur, 4 tibia, size 10 mm PS AOX insert, and 35 Anatomic AOX patellar   button.      SURGEON:  Madlyn FrankelMatthew D. Charlann Boxerlin, M.D.      ASSISTANT:  Lanney GinsMatthew Babish, PA-C.      ANESTHESIA:  Spinal.      SPECIMENS:  None.      COMPLICATION:  None.      DRAINS:  None.  EBL: <100cc      TOURNIQUET TIME:   Total Tourniquet Time Documented: Thigh (Left) - 38 minutes Total: Thigh (Left) - 38 minutes  .      The patient was stable to the recovery room.      INDICATION FOR PROCEDURE:  Sherry Gutierrez is a 63 y.o. female patient of   mine.  The patient had been seen, evaluated, and treated conservatively in the   office with medication, activity modification, and injections.  The patient had   radiographic changes of bone-on-bone arthritis with endplate sclerosis and osteophytes noted.      The patient failed conservative measures including medication, injections, and activity modification, and at this point was ready for more definitive measures.   Based on the radiographic changes and failed conservative measures, the patient   decided to proceed with total knee replacement.  Risks of infection,   DVT,  component failure, need for revision surgery, postop course, and   expectations were all   discussed and reviewed.  Consent was obtained for benefit of pain   relief.      PROCEDURE IN DETAIL:  The patient was brought to the operative theater.   Once adequate anesthesia, preoperative antibiotics, 2 gm of Ancef administered, the patient was positioned supine with the left thigh tourniquet placed.  The  left lower extremity was prepped and draped in sterile fashion.  A time-   out was performed identifying the patient, planned procedure, and   extremity.      The left lower extremity [facility].  The leg was   exsanguinated, tourniquet elevated to 250 mmHg.  A midline incision was   made followed by median parapatellar arthrotomy.  Following initial  exposure, attention was first directed to the patella.  Precut   measurement was noted to be 23 mm.  I resected down to 14 mm and used a   35 patellar button to restore patellar height as well as cover the cut   surface.      The lug holes were drilled and a metal shim was placed to protect the   patella from retractors and saw blades.      At this point, attention was now directed to the femur.  The femoral   canal was opened with a drill, irrigated to try to prevent fat emboli.  An   intramedullary rod was passed at 3 degrees valgus, 9 mm of bone was   resected off the distal femur.  Following this resection, the tibia was   subluxated anteriorly.  Using the extramedullary guide, 4 mm of bone was resected off   the proximal medial tibia.  We confirmed the gap would be   stable medially and laterally with a size 6 insert as well as confirmed   the cut was perpendicular in the coronal plane, checking with an alignment rod.      Once this was done, I sized the femur to be a size 5 in the anterior-   posterior dimension, chose a narrow component based on medial and   lateral dimension.  The size 5 rotation block was  then pinned in   position anterior referenced using the tensioning device to set rotation.  The   anterior, posterior, and  chamfer cuts were made without difficulty nor   notching making certain that I was along the anterior cortex to help   with flexion gap stability.      The final box cut was made off the lateral aspect of distal femur.      At this point, the tibia was sized to be a size 4, the size 4 tray was   then pinned in position through the medial third of the tubercle,   drilled, and keel punched.  Trial reduction was now carried with a 5 femur,  4 tibia, a 8 mm insert, and the 35 patella botton.  The knee was brought to   extension, full extension with good flexion stability with the patella   tracking through the trochlea without application of pressure.  Given   all these findings, the trial components removed.  Final components were   opened and cement was mixed.  The knee was irrigated with normal saline   solution and pulse lavage.  The synovial lining was   then injected with 20cc of Exparel, 30cc of 0.25% Marcaine with epinephrine and 1 cc of Toradol,   total of 61 cc.      The knee was irrigated.  Final implants were then cemented onto clean and   dried cut surfaces of bone with the knee brought to extension with a size 10 mm trial insert.      Once the cement had fully cured, the excess cement was removed   throughout the knee.  I confirmed I was satisfied with the range of   motion and stability, and the final 10 mm PS insert was chosen.  It was   placed into the knee.      The tourniquet had been let down at 37 minutes.  No significant   hemostasis required.  The   extensor mechanism was then reapproximated using #1 Vicryl with the knee   in flexion.  The   remaining wound was closed with 2-0 Vicryl and running 4-0 Monocryl.   The knee was cleaned, dried, dressed sterilely using Dermabond and   Aquacel dressing.  The patient was then   brought to recovery  room in stable condition, tolerating the procedure   well.   Please note that Physician Assistant, Lanney GinsMatthew Babish, was present for the entirety of the case, and was utilized for pre-operative positioning, peri-operative retractor management, general facilitation of the procedure.  He was also utilized for primary wound closure at the end of the case.              Madlyn FrankelMatthew D. Charlann Boxerlin, M.D.    01/18/2014 5:41 PM

## 2014-01-18 NOTE — Anesthesia Postprocedure Evaluation (Signed)
  Anesthesia Post-op Note  Patient: Sherry Gutierrez  Procedure(s) Performed: Procedure(s) (LRB): LEFT TOTAL KNEE ARTHROPLASTY (Left)  Patient Location: PACU  Anesthesia Type: Spinal  Level of Consciousness: awake and alert   Airway and Oxygen Therapy: Patient Spontanous Breathing  Post-op Pain: mild  Post-op Assessment: Post-op Vital signs reviewed, Patient's Cardiovascular Status Stable, Respiratory Function Stable, Patent Airway and No signs of Nausea or vomiting  Last Vitals:  Filed Vitals:   01/18/14 1800  BP: 109/62  Pulse: 87  Temp:   Resp: 13    Post-op Vital Signs: stable   Complications: No apparent anesthesia complications

## 2014-01-19 ENCOUNTER — Encounter (HOSPITAL_COMMUNITY): Payer: Self-pay | Admitting: *Deleted

## 2014-01-19 DIAGNOSIS — D500 Iron deficiency anemia secondary to blood loss (chronic): Secondary | ICD-10-CM | POA: Diagnosis not present

## 2014-01-19 DIAGNOSIS — E669 Obesity, unspecified: Secondary | ICD-10-CM | POA: Diagnosis present

## 2014-01-19 LAB — BASIC METABOLIC PANEL
BUN: 16 mg/dL (ref 6–23)
CALCIUM: 8.9 mg/dL (ref 8.4–10.5)
CHLORIDE: 103 meq/L (ref 96–112)
CO2: 27 meq/L (ref 19–32)
Creatinine, Ser: 0.68 mg/dL (ref 0.50–1.10)
GFR calc Af Amer: >90 mL/min (ref >90)
GFR calc non Af Amer: >90 mL/min (ref >90)
GLUCOSE: 140 mg/dL — AB (ref 70–99)
POTASSIUM: 4.6 meq/L (ref 3.7–5.3)
SODIUM: 140 meq/L (ref 137–147)

## 2014-01-19 LAB — CBC
HCT: 34.9 % — ABNORMAL LOW (ref 36.0–46.0)
HEMOGLOBIN: 11.1 g/dL — AB (ref 12.0–15.0)
MCH: 27.9 pg (ref 26.0–34.0)
MCHC: 31.8 g/dL (ref 30.0–36.0)
MCV: 87.7 fL (ref 78.0–100.0)
PLATELETS: 283 10*3/uL (ref 150–400)
RBC: 3.98 MIL/uL (ref 3.87–5.11)
RDW: 13.8 % (ref 11.5–15.5)
WBC: 10.8 10*3/uL — AB (ref 4.0–10.5)

## 2014-01-19 MED ORDER — ASPIRIN 325 MG PO TBEC
325.0000 mg | DELAYED_RELEASE_TABLET | Freq: Two times a day (BID) | ORAL | Status: DC
Start: 2014-01-19 — End: 2014-01-19

## 2014-01-19 MED ORDER — DSS 100 MG PO CAPS: 100.0000 mg | ORAL_CAPSULE | Freq: Two times a day (BID) | ORAL | Status: DC

## 2014-01-19 MED ORDER — HYDROCODONE-ACETAMINOPHEN 7.5-325 MG PO TABS: 1.0000 | ORAL_TABLET | ORAL | Status: DC | PRN

## 2014-01-19 MED ORDER — FERROUS SULFATE 325 (65 FE) MG PO TABS: 325.0000 mg | ORAL_TABLET | Freq: Three times a day (TID) | ORAL | Status: DC

## 2014-01-19 MED ORDER — METHOCARBAMOL 500 MG PO TABS: 500.0000 mg | ORAL_TABLET | Freq: Four times a day (QID) | ORAL | Status: DC | PRN

## 2014-01-19 MED ORDER — POLYETHYLENE GLYCOL 3350 17 G PO PACK: 17.0000 g | PACK | Freq: Two times a day (BID) | ORAL | Status: DC

## 2014-01-19 MED ORDER — ASPIRIN 325 MG PO TBEC: 325.0000 mg | DELAYED_RELEASE_TABLET | Freq: Two times a day (BID) | ORAL | Status: DC

## 2014-01-19 MED ORDER — ASPIRIN 325 MG PO TBEC: 325.0000 mg | DELAYED_RELEASE_TABLET | Freq: Two times a day (BID) | ORAL | Status: AC

## 2014-01-19 NOTE — Progress Notes (Signed)
1800 patient has not voided since foley out this am bladder scanned 100cc, matt babish PA notiified with orders received.Sharrell Ku. Sherry Franklin RN

## 2014-01-19 NOTE — Progress Notes (Signed)
OT Cancellation Note  Patient Details Name: Sherry Gutierrez MRN: [id number] DOB: 02/05/1951   Cancelled Treatment:    Reason Eval/Treat Not Completed: Other (comment) Pt feeling nauseous although reports it is improved from earlier. She requests to hold off on OT currently. Will check back later time.   Lennox LaityStone, Stephanie Stafford [phone] 01/19/2014, 11:38 AM

## 2014-01-19 NOTE — Evaluation (Signed)
Physical Therapy Evaluation Patient Details Name: Sherry Gutierrez MRN: [id number] DOB: June 20, 1951 Today's Date: 01/19/2014   History of Present Illness  Pt is a 63 year old female s/p L TKA.  Clinical Impression  Pt is s/p L TKA resulting in the deficits listed below (see PT Problem List).  Pt will benefit from skilled PT to increase their independence and safety with mobility to allow discharge to the venue listed below.  Pt ambulated short distance in hallway and performed exercises.  Pt plans to d/c home with family assist.     Follow Up Recommendations Home health PT    Equipment Recommendations  Rolling walker with 5" wheels    Recommendations for Other Services       Precautions / Restrictions Precautions Precautions: Knee Restrictions Other Position/Activity Restrictions: WBAT      Mobility  Bed Mobility Overal bed mobility: Needs Assistance Bed Mobility: Supine to Sit     Supine to sit: Min assist     General bed mobility comments: assist for L LE  Transfers Overall transfer level: Needs assistance Equipment used: Rolling walker (2 wheeled) Transfers: Sit to/from Stand Sit to Stand: Min assist;From elevated surface         General transfer comment: assist to rise and steady, verbal cues for safe technique  Ambulation/Gait Ambulation/Gait assistance: Min assist Ambulation Distance (Feet): 40 Feet Assistive device: Rolling walker (2 wheeled) Gait Pattern/deviations: Step-to pattern;Antalgic;Trunk flexed     General Gait Details: verbal cues for sequence, RW distance, posture  Stairs            Wheelchair Mobility    Modified Rankin (Stroke Patients Only)       Balance                                             Pertinent Vitals/Pain Pt reports premedication, activity to tolerance, ice packs applied    Home Living Family/patient expects to be discharged to:: Private residence Living Arrangements: Other  relatives Available Help at Discharge: Family;Available 24 hours/day Type of Home: House Home Access: Stairs to enter Entrance Stairs-Rails: None Entrance Stairs-Number of Steps: 3 Home Layout: One level Home Equipment: None      Prior Function Level of Independence: Independent               Hand Dominance        Extremity/Trunk Assessment               Lower Extremity Assessment: LLE deficits/detail   LLE Deficits / Details: fair quad contraction, unable to perform SLR, L knee flexion AAROM 50*     Communication   Communication: No difficulties  Cognition Arousal/Alertness: Awake/alert Behavior During Therapy: WFL for tasks assessed/performed Overall Cognitive Status: Within Functional Limits for tasks assessed                      General Comments      Exercises Total Joint Exercises Ankle Circles/Pumps: AROM;Both;15 reps Quad Sets: AROM;Both;15 reps Short Arc Quad: AROM;Left;15 reps Heel Slides: AAROM;Left;15 reps Hip ABduction/ADduction: AAROM;Left;15 reps      Assessment/Plan    PT Assessment Patient needs continued PT services  PT Diagnosis Difficulty walking;Acute pain   PT Problem List Decreased strength;Decreased range of motion;Decreased knowledge of use of DME;Decreased knowledge of precautions;Decreased mobility;Pain  PT Treatment Interventions Functional mobility training;Stair training;Gait training;DME  instruction;Patient/family education;Therapeutic activities;Therapeutic exercise   PT Goals (Current goals can be found in the Care Plan section) Acute Rehab PT Goals PT Goal Formulation: With patient Time For Goal Achievement: 01/22/14 Potential to Achieve Goals: Good    Frequency 7X/week   Barriers to discharge        Co-evaluation               End of Session Equipment Utilized During Treatment: Gait belt Activity Tolerance: Patient limited by fatigue;Patient limited by pain Patient left: in chair;with call  bell/phone within reach           Time: 0906-0932 PT Time Calculation (min): 26 min   Charges:   PT Evaluation $Initial PT Evaluation Tier I: 1 Procedure PT Treatments $Gait Training: 8-22 mins $Therapeutic Exercise: 8-22 mins   PT G Codes:          LEMYRE,KATHrine E 01/19/2014, 12:43 PM Zenovia JarredKati Lemyre, PT, DPT 01/19/2014 Pager: [phone]

## 2014-01-19 NOTE — Progress Notes (Signed)
Physical Therapy Treatment Note   01/19/14 1500  PT Visit Information  Last PT Received On 01/19/14  Assistance Needed +1  History of Present Illness Pt is a 63 year old female s/p L TKA.  PT Time Calculation  PT Start Time 1459  PT Stop Time 1512  PT Time Calculation (min) 13 min  Subjective Data  Subjective Pt reporting nausea earlier however currently better and agreeable to ambulate in hallway.  Precautions  Precautions Knee  Restrictions  Other Position/Activity Restrictions WBAT  Cognition  Arousal/Alertness Awake/alert  Behavior During Therapy WFL for tasks assessed/performed  Overall Cognitive Status Within Functional Limits for tasks assessed  Bed Mobility  Overal bed mobility Needs Assistance  Bed Mobility Supine to Sit;Sit to Supine  Supine to sit Supervision  Sit to supine Min assist  General bed mobility comments verbal cues for technique, pt attempted self assist for L LE onto bed however unable so assist provided  Transfers  Overall transfer level Needs assistance  Equipment used Rolling walker (2 wheeled)  Transfers Sit to/from Stand  Sit to Stand Min guard  General transfer comment pt able to recall safe technique however cued for L LE forward  Ambulation/Gait  Ambulation/Gait assistance Min guard  Ambulation Distance (Feet) 80 Feet  Assistive device Rolling walker (2 wheeled)  Gait Pattern/deviations Step-to pattern;Antalgic;Trunk flexed  General Gait Details verbal cues for posture and heel strike  PT - End of Session  Activity Tolerance Patient tolerated treatment well  Patient left in bed;with call bell/phone within reach;with nursing/sitter in room  PT - Assessment/Plan  PT Plan Current plan remains appropriate  PT Frequency 7X/week  Follow Up Recommendations Home health PT  PT equipment Rolling walker with 5" wheels  PT Goal Progression  Progress towards PT goals Progressing toward goals  PT General Charges  $$ ACUTE PT VISIT 1 Procedure  PT  Treatments  $Gait Training 8-22 mins   Zenovia JarredKati Maylynn Orzechowski, PT, DPT 01/19/2014 Pager: [phone]

## 2014-01-19 NOTE — Progress Notes (Signed)
[facility] is [facility] the [facility] services: RW and Commode  If patient discharges after hours, please call [phone].   Renard HamperLecretia Williamson 01/19/2014, 8:31 AM

## 2014-01-19 NOTE — Progress Notes (Signed)
   Subjective: 1 Day Post-Op Procedure(s) (LRB): LEFT TOTAL KNEE ARTHROPLASTY (Left)   Seen by Dr. Charlann Boxerlin. Patient reports pain as mild, controlled with medication. No events throughout the night. Little anxious about working with PT. Ready to be discharged home is she does well with PT and pain stays controlled.    Objective:   VITALS:   Filed Vitals:   01/19/14 0644  BP: 109/72  Pulse: 60  Temp: 97.5 F (36.4 C)  Resp: 16    Neurovascular intact Dorsiflexion/Plantar flexion intact Incision: dressing C/D/I No cellulitis present Compartment soft  LABS  Recent Labs  01/19/14 0425  HGB 11.1*  HCT 34.9*  WBC 10.8*  PLT 283     Recent Labs  01/19/14 0425  NA 140  K 4.6  BUN 16  CREATININE 0.68  GLUCOSE 140*     Assessment/Plan: 1 Day Post-Op Procedure(s) (LRB): LEFT TOTAL KNEE ARTHROPLASTY (Left) Foley cath d/c'ed Advance diet Up with therapy D/C IV fluids Discharge home with home health [facility]. Follow up with OLIN,MATTHEW D in 2 weeks.  Contact information:  [facility] [address], [address] [place] [id number] [phone]    Expected ABLA  Treated with iron and will observe  Obese (BMI 30-39.9) Estimated body mass index is 38.11 kg/(m^2) as calculated from the following:   Height as of this encounter: 5\' 6"  (1.676 m).   Weight as of this encounter: 107.049 kg (236 lb). Patient also counseled that weight may inhibit the healing process Patient counseled that losing weight will help with future health issues      Anastasio AuerbachMatthew S. Babish   PAC  01/19/2014, 9:16 AM

## 2014-01-20 LAB — BASIC METABOLIC PANEL
BUN: 13 mg/dL (ref 6–23)
CHLORIDE: 102 meq/L (ref 96–112)
CO2: 26 meq/L (ref 19–32)
CREATININE: 0.71 mg/dL (ref 0.50–1.10)
Calcium: 8.5 mg/dL (ref 8.4–10.5)
GFR calc Af Amer: >90 mL/min (ref >90)
GFR calc non Af Amer: >90 mL/min (ref >90)
GLUCOSE: 121 mg/dL — AB (ref 70–99)
Potassium: 3.5 mEq/L — ABNORMAL LOW (ref 3.7–5.3)
Sodium: 140 mEq/L (ref 137–147)

## 2014-01-20 LAB — CBC
HCT: 31.4 % — ABNORMAL LOW (ref 36.0–46.0)
HEMOGLOBIN: 10.2 g/dL — AB (ref 12.0–15.0)
MCH: 28.5 pg (ref 26.0–34.0)
MCHC: 32.5 g/dL (ref 30.0–36.0)
MCV: 87.7 fL (ref 78.0–100.0)
PLATELETS: 244 10*3/uL (ref 150–400)
RBC: 3.58 MIL/uL — AB (ref 3.87–5.11)
RDW: 13.7 % (ref 11.5–15.5)
WBC: 6.9 10*3/uL (ref 4.0–10.5)

## 2014-01-20 NOTE — Progress Notes (Signed)
Physical Therapy Treatment Patient Details Name: Sherry Gutierrez MRN: [name] DOB: 01/09/1951 Today's Date: 01/20/2014    History of Present Illness Pt is a 63 year old female s/p L TKA.    PT Comments    POD # 2 am session.  Pt OOB in recliner. Amb in hallway limited distance then performed TKR TE's following HEP handout followed by ICE.  Pt required increased time and several rest breaks to minimize the potential of nausea.  Pt stated she was not going home today.  Follow Up Recommendations  Home health PT     Equipment Recommendations  Rolling walker with 5" wheels    Recommendations for Other Services       Precautions / Restrictions      Mobility  Bed Mobility               General bed mobility comments: Pt OOB in recliner  Transfers Overall transfer level: Needs assistance Equipment used: Rolling walker (2 wheeled) Transfers: Sit to/from Stand Sit to Stand: Min guard;Supervision         General transfer comment: min verbal cues for hand placement. plus increased time  Ambulation/Gait Ambulation/Gait assistance: Min guard Ambulation Distance (Feet): 65 Feet Assistive device: Rolling walker (2 wheeled) Gait Pattern/deviations: Step-to pattern;Decreased stance time - left Gait velocity: decreased   General Gait Details: verbal cues for posture and heel strike   Stairs            Wheelchair Mobility    Modified Rankin (Stroke Patients Only)       Balance                                    Cognition                            Exercises   Total Knee Replacement TE's 10 reps B LE ankle pumps 10 reps towel squeezes 10 reps knee presses 10 reps heel slides  10 reps SAQ's 10 reps SLR's 10 reps ABD Followed by ICE     General Comments        Pertinent Vitals/Pain C/o 6/10 pain Pre medicated ICE applied    Home Living                      Prior Function            PT Goals (current  goals can now be found in the care plan section) Progress towards PT goals: Progressing toward goals    Frequency  7X/week    PT Plan      Co-evaluation             End of Session Equipment Utilized During Treatment: Gait belt Activity Tolerance: Patient tolerated treatment well Patient left: in chair;with call bell/phone within reach     Time: 0930-1010 PT Time Calculation (min): 40 min  Charges:  $Gait Training: 8-22 mins $Therapeutic Exercise: 8-22 mins $Therapeutic Activity: 8-22 mins                    G Codes:      Sherry Gutierrez  PTA [facility]  Acute  Rehab Pager      [phone]

## 2014-01-20 NOTE — Progress Notes (Signed)
Physical Therapy Treatment Patient Details Name: Francisca DecemberMarilyn G Faison MRN: [id number] DOB: 07/21/1951 Today's Date: 01/20/2014    History of Present Illness Pt is a [age] year old female s/p L TKA.    PT Comments    POD # 2 pm session.  Assisted pt OOB to amb in hallway then back to bed per pt request due to MAX c/o fatigue.  Pt required increased time.  Progressing slowly and plans to D/C to home tomorrow.  Will see in am for stair training.  Follow Up Recommendations  Home health PT     Equipment Recommendations  Rolling walker with 5" wheels    Recommendations for Other Services       Precautions / Restrictions      Mobility  Bed Mobility           Sit to supine: Min assist   General bed mobility comments: Assisted back to bed with min assist instructing/demonstrating how to use a belt to assist L LE up on to bed.  Transfers Overall transfer level: Needs assistance Equipment used: Rolling walker (2 wheeled) Transfers: Sit to/from Stand Sit to Stand: Min guard;Supervision         General transfer comment: min verbal cues for hand placement. plus increased time  Ambulation/Gait Ambulation/Gait assistance: Min guard Ambulation Distance (Feet): 72 Feet Assistive device: Rolling walker (2 wheeled) Gait Pattern/deviations: Step-to pattern;Decreased stance time - left Gait velocity: decreased   General Gait Details: verbal cues for posture and heel strike   Stairs            Wheelchair Mobility    Modified Rankin (Stroke Patients Only)       Balance                                    Cognition                            Exercises      General Comments        Pertinent Vitals/Pain 5/10 pain "stiffness" ICE applied    Home Living                      Prior Function            PT Goals (current goals can now be found in the care plan section) Progress towards PT goals: Progressing toward goals     Frequency  7X/week    PT Plan      Co-evaluation             End of Session Equipment Utilized During Treatment: Gait belt Activity Tolerance: Patient tolerated treatment well Patient left: in bed;with call bell/phone within reach     Time: 1420-1446 PT Time Calculation (min): 26 min  Charges:  $Gait Training: 8-22 mins $Therapeutic Activity: 8-22 mins                    G Codes:      Felecia ShellingLori Jazmina Muhlenkamp  PTA [facility]  Acute  Rehab Pager      [phone]

## 2014-01-20 NOTE — Evaluation (Signed)
Occupational Therapy Evaluation Patient Details Name: Sherry Gutierrez MRN: [id number] DOB: [birth year] Today's Date: 01/20/2014    History of Present Illness Pt is a 63 year old female s/p L TKA.   Clinical Impression   Pt feeling warm and nauseous half way through session. Not able to tolerate more than toilet transfer this session. Began education on LB self care and showering. Nursing and PA aware of nausea persisting today. Feel pt can benefit from additional OT before d/c home.    Follow Up Recommendations  No OT follow up;Supervision/Assistance - 24 hour    Equipment Recommendations  3 in 1 bedside comode (in room)    Recommendations for Other Services       Precautions / Restrictions Precautions Precautions: Knee Restrictions Weight Bearing Restrictions: No Other Position/Activity Restrictions: WBAT      Mobility Bed Mobility Overal bed mobility: Needs Assistance Bed Mobility: Supine to Sit     Supine to sit: Min guard     General bed mobility comments: min verbal cues for technique  Transfers Overall transfer level: Needs assistance Equipment used: Rolling walker (2 wheeled) Transfers: Sit to/from Stand Sit to Stand: Min guard         General transfer comment: min verbal cues for hand placement.    Balance                                            ADL Overall ADL's : Needs assistance/impaired Eating/Feeding: Independent;Sitting   Grooming: Wash/dry hands;Set up;Sitting   Upper Body Bathing: Set up;Sitting   Lower Body Bathing: Moderate assistance;Sit to/from stand   Upper Body Dressing : Set up;Sitting   Lower Body Dressing: Moderate assistance;Sit to/from stand   Toilet Transfer: Min guard;Ambulation;BSC;RW   Toileting- ArchitectClothing Manipulation and Hygiene: Min guard;Sit to/from stand         General ADL Comments: Pt did well with up to the 3in1 in the bathroom but started to feel warm and nauseous once in the  bathroom. Nursing in room to give pain meds and made aware. Pt tranferred out of the bathroom but states she needed to sit down in recliner as nausea was continuing. She needed verbal cues to back up to the recliner fully as she was in a hurry to sit down. PA in room while OT finishing working with pt and is aware of nausea. Reviewed shower transfer technique and demonstrated for pt. She states she will sponge bathe a few days initially and daughter can assist with LB self care. She does have a reacher so demonstrated how she can use the reacher for retrieving items and dressing. Discussed use of 3in1 as shower seat.      Vision                     Perception     Praxis      Pertinent Vitals/Pain 5/10 L knee; reposition, ice, informed nursing.     Hand Dominance     Extremity/Trunk Assessment Upper Extremity Assessment Upper Extremity Assessment: Overall WFL for tasks assessed           Communication Communication Communication: No difficulties   Cognition Arousal/Alertness: Awake/alert Behavior During Therapy: WFL for tasks assessed/performed Overall Cognitive Status: Within Functional Limits for tasks assessed  General Comments       Exercises       Shoulder Instructions      Home Living Family/patient expects to be discharged to:: Private residence Living Arrangements: Other relatives Available Help at Discharge: Family;Available 24 hours/day Type of Home: House Home Access: Stairs to enter Entergy CorporationEntrance Stairs-Number of Steps: 3 Entrance Stairs-Rails: None Home Layout: One level     Bathroom Shower/Tub: Producer, television/film/videoWalk-in shower   Bathroom Toilet: Standard     Home Equipment: MudloggerAdaptive equipment Adaptive Equipment: Reacher        Prior Functioning/Environment Level of Independence: Independent             OT Diagnosis: Generalized weakness   OT Problem List: Decreased strength;Decreased knowledge of use of DME or  AE;Decreased activity tolerance   OT Treatment/Interventions: Self-care/ADL training;Patient/family education;Therapeutic activities;DME and/or AE instruction    OT Goals(Current goals can be found in the care plan section) Acute Rehab OT Goals Patient Stated Goal: feel better OT Goal Formulation: With patient Time For Goal Achievement: 01/27/14 Potential to Achieve Goals: Good  OT Frequency: Min 2X/week   Barriers to D/C:            Co-evaluation              End of Session Equipment Utilized During Treatment: Gait belt;Rolling walker  Activity Tolerance: Other (comment) (nausea) Patient left: in chair;with call bell/phone within reach   Time: 0835-0905 OT Time Calculation (min): 30 min Charges:  OT General Charges $OT Visit: 1 Procedure OT Evaluation $Initial OT Evaluation Tier I: 1 Procedure OT Treatments $Self Care/Home Management : 8-22 mins $Therapeutic Activity: 8-22 mins G-Codes:    Lennox LaityStone, Darienne Belleau [place] [phone] 01/20/2014, 9:18 AM

## 2014-01-20 NOTE — Progress Notes (Signed)
   Subjective: 2 Days Post-Op Procedure(s) (LRB): LEFT TOTAL KNEE ARTHROPLASTY (Left)   Patient reports pain as mild, pain controlled. Complaining of not feeling right, dizzy and nauseous.Doesn't feel that she is doing well here in the hospital.  She feels better that she has been able to urinate, but not quite right yet.  Objective:   VITALS:   Filed Vitals:   01/20/14 0547  BP: 127/85  Pulse: 73  Temp: 97.8 F (36.6 C)  Resp: 16    Neurovascular intact Dorsiflexion/Plantar flexion intact Incision: dressing C/D/I No cellulitis present Compartment soft  LABS  Recent Labs  01/19/14 0425 01/20/14 0403  HGB 11.1* 10.2*  HCT 34.9* 31.4*  WBC 10.8* 6.9  PLT 283 244     Recent Labs  01/19/14 0425 01/20/14 0403  NA 140 140  K 4.6 3.5*  BUN 16 13  CREATININE 0.68 0.71  GLUCOSE 140* 121*     Assessment/Plan: 2 Days Post-Op Procedure(s) (LRB): LEFT TOTAL KNEE ARTHROPLASTY (Left) Up with therapy Discharge home with home health eventually, when ready  Expected ABLA  Treated with iron and will observe  Obese (BMI 30-39.9) Estimated body mass index is 38.11 kg/(m^2) as calculated from the following:   Height as of this encounter: 5\' 6"  (1.676 m).   Weight as of this encounter: 107.049 kg (236 lb). Patient also counseled that weight may inhibit the healing process Patient counseled that losing weight will help with future health issues       Anastasio AuerbachMatthew S. Lucetta Baehr   PAC  01/20/2014, 9:38 AM

## 2014-01-21 MED ORDER — PROMETHAZINE HCL 12.5 MG PO TABS: 12.5000 mg | ORAL_TABLET | Freq: Four times a day (QID) | ORAL | Status: DC | PRN

## 2014-01-21 NOTE — Progress Notes (Signed)
Physical Therapy Treatment Patient Details Name: Sherry Gutierrez MRN: [id number] DOB: Dec 06, 1950 Today's Date: 01/21/2014    History of Present Illness Pt is a 63 year old female s/p L TKA.    PT Comments    Pt plans to D/C to home today.  Mild c/o nausea this am with OT.  Required rest break.  Amb in hallway then practiced stairs.  Pt required increased time and VC's for safety using RW in tight spaces.  Instructed on HEP and freq.  Instructed on use of ICE.  Instructed on proper tech car transfers.   Follow Up Recommendations  Home health PT     Equipment Recommendations  Rolling walker with 5" wheels    Recommendations for Other Services       Precautions / Restrictions Precautions Precautions: Knee Restrictions Weight Bearing Restrictions: No Other Position/Activity Restrictions: WBAT    Mobility  Bed Mobility               General bed mobility comments: Pt OOB in recliner  Transfers Overall transfer level: Needs assistance Equipment used: Rolling walker (2 wheeled) Transfers: Sit to/from Stand Sit to Stand: Supervision         General transfer comment: increased time but good safety tech  Ambulation/Gait Ambulation/Gait assistance: Supervision   Assistive device: Rolling walker (2 wheeled) Gait Pattern/deviations: Step-to pattern;Decreased stance time - left Gait velocity: decreased   General Gait Details: VC's to increase posture   Stairs Stairs: Yes Stairs assistance: Min assist Stair Management: No rails;Step to pattern;Backwards;With walker Number of Stairs: 4 General stair comments: 50% VC's on proper tech and safety  Wheelchair Mobility    Modified Rankin (Stroke Patients Only)       Balance                                    Cognition                            Exercises      General Comments        Pertinent Vitals/Pain C/o 5/10 pain Pre medicated    Home Living                      Prior Function            PT Goals (current goals can now be found in the care plan section) Progress towards PT goals: Progressing toward goals    Frequency  7X/week    PT Plan      Co-evaluation             End of Session Equipment Utilized During Treatment: Gait belt Activity Tolerance: Patient tolerated treatment well Patient left: in chair;with call bell/phone within reach     Time: 1125-1204 PT Time Calculation (min): 39 min  Charges:  $Gait Training: 8-22 mins $Therapeutic Activity: 8-22 mins $Self Care/Home Management: 8-22                    G Codes:      Felecia ShellingLori Haneen Bernales  PTA [facility]  Acute  Rehab Pager      [phone]

## 2014-01-21 NOTE — Progress Notes (Signed)
   Subjective: 3 Days Post-Op Procedure(s) (LRB): LEFT TOTAL KNEE ARTHROPLASTY (Left)   Patient reports pain as mild, pain controlled. No events throughout the night. Ready to be discharged home.  Objective:   VITALS:   Filed Vitals:   01/21/14  BP: 106/68  Pulse: 91  Temp: 98.4 F (36.9 C)   Resp: 14    Dorsiflexion/Plantar flexion intact Incision: dressing C/D/I No cellulitis present Compartment soft  LABS  Recent Labs  01/19/14 0425 01/20/14 0403  HGB 11.1* 10.2*  HCT 34.9* 31.4*  WBC 10.8* 6.9  PLT 283 244     Recent Labs  01/19/14 0425 01/20/14 0403  NA 140 140  K 4.6 3.5*  BUN 16 13  CREATININE 0.68 0.71  GLUCOSE 140* 121*     Assessment/Plan: 3 Days Post-Op Procedure(s) (LRB): LEFT TOTAL KNEE ARTHROPLASTY (Left) Up with therapy Discharge home with home health [facility]. Follow up with OLIN,Marijane Trower D in 2 weeks.  Contact information:  [facility] [address], [address] [place] [id number] [phone]    Expected ABLA  Treated with iron and will observe   Obese (BMI 30-39.9)  Estimated body mass index is 38.11 kg/(m^2) as calculated from the following:      Height as of this encounter: 5\' 6"  (1.676 m).      Weight as of this encounter: 107.049 kg (236 lb).  Patient also counseled that weight may inhibit the healing process  Patient counseled that losing weight will help with future health issues       Anastasio AuerbachMatthew S. Mee Macdonnell   PAC  01/21/2014, 10:41 AM

## 2014-01-21 NOTE — Progress Notes (Signed)
RN reviewed discharge instructions with patient and family. All questions answered.  Paperwork and prescriptions given.   NT rolled patient down in wheelchair to family car.  

## 2014-01-21 NOTE — Progress Notes (Signed)
Occupational Therapy Treatment Patient Details Name: Sherry Gutierrez MRN: [id number] DOB: 27-Sep-1950 Today's Date: 01/21/2014    History of present illness Pt is a 63 year old female s/p L TKA.   OT comments  Pt continues to have some nausea that started about half way though session. Encouraged plenty of rest breaks and used cool washcloths. Nursing and PA made aware. Pt did well with functional transfers and ADL. She will have assist at home.   Follow Up Recommendations  No OT follow up;Supervision/Assistance - 24 hour    Equipment Recommendations  3 in 1 bedside comode (in room)    Recommendations for Other Services      Precautions / Restrictions Precautions Precautions: Knee Restrictions Weight Bearing Restrictions: No Other Position/Activity Restrictions: WBAT       Mobility Bed Mobility                  Transfers Overall transfer level: Needs assistance Equipment used: Rolling walker (2 wheeled) Transfers: Sit to/from Stand Sit to Stand: Min guard         General transfer comment: did well with hand placement. Min cues for LE management.    Balance                                   ADL       Grooming: Min Production designer, theatre/television/filmguard;Standing                   Toilet Transfer: Min guard;Ambulation;BSC;RW   Toileting- ArchitectClothing Manipulation and Hygiene: Min guard;Sit to/from stand         General ADL Comments: Pt wanting to wash up and dress for d/c. Assisted pt to stand and wash periareas and pull up pants with min guard assist. Reviewed sequence for LB dressing and pt states she will continue to sponge bathe at least for a week. Discussed how to adjust 3in1 for appropriate height. She tried to steer walker with one hand from the 3in1 over to the sink because she didnt want to get the walker handle dirty but explained the safety implications of doing so. Cleaned the walker handle after use and explained to pt the importance of having both hands  on walker during functional mobility and that she can always clean off handle once seated. Pt feeling a little warm and nauseous during sessino but did well with all activity. Nursing and PA informed of  nausea. Pt states she will have assist for LB self care at home.       Vision                     Perception     Praxis      Cognition   Behavior During Therapy: [facility] Center[facility]WFL for tasks assessed/performed Overall Cognitive Status: Within Functional Limits for tasks assessed                       Extremity/Trunk Assessment               Exercises     Shoulder Instructions       General Comments      Pertinent Vitals/ Pain       4/10 L knee; reposition, ice  Home Living  Prior Functioning/Environment              Frequency Min 2X/week     Progress Toward Goals  OT Goals(current goals can now be found in the care plan section)  Progress towards OT goals: Progressing toward goals     Plan Discharge plan remains appropriate    Co-evaluation                 End of Session Equipment Utilized During Treatment: Rolling walker   Activity Tolerance Other (comment) (some nausea)   Patient Left in chair;with call bell/phone within reach   Nurse Communication          Time: 1000-1031 OT Time Calculation (min): 31 min  Charges: OT General Charges $OT Visit: 1 Procedure OT Treatments $Self Care/Home Management : 8-22 mins $Therapeutic Activity: 8-22 mins  Lennox LaityStone, Suhaib Guzzo [place] [phone] 01/21/2014, 11:14 AM

## 2014-01-23 NOTE — Discharge Summary (Signed)
Physician Discharge Summary  Patient ID: Sherry DecemberMarilyn G Rodkey MRN: [id number] DOB/AGE: [birth year] 63 y.o.  Admit date: 01/18/2014 Discharge date: 01/21/2014   Procedures:  Procedure(s) (LRB): LEFT TOTAL KNEE ARTHROPLASTY (Left)  Attending Physician:  Dr. Durene RomansMatthew Olin   Admission Diagnoses:   Left knee OA / pain  Discharge Diagnoses:  Principal Problem:   S/P left TKA Active Problems:   Expected blood loss anemia   Obese  Past Medical History  Diagnosis Date  . Hypertension   . Arthritis     OA LEFT KNEE    HPI: Sherry Gutierrez, 63 y.o. female, has a history of pain and functional disability in the left knee due to arthritis and has failed non-surgical conservative treatments for greater than 12 weeks to include NSAID's and/or analgesics and activity modification. Onset of symptoms was gradual, starting 3+ years ago with gradually worsening course since that time. The patient noted no past surgery on the left knee(s). Patient currently rates pain in the left knee(s) at 10 out of 10 with activity. Patient has worsening of pain with activity and weight bearing, pain that interferes with activities of daily living, pain with passive range of motion, crepitus and joint swelling. Patient has evidence of periarticular osteophytes and joint space narrowing by imaging studies. There is no active infection. Risks, benefits and expectations were discussed with the patient. Risks including but not limited to the risk of anesthesia, blood clots, nerve damage, blood vessel damage, failure of the prosthesis, infection and up to and including death. Patient understand the risks, benefits and expectations and wishes to proceed with surgery.   PCP: No primary provider on file.   Discharged Condition: good  Hospital Course:  Patient underwent the above stated procedure on 01/18/2014. Patient tolerated the procedure well and brought to the recovery room in good condition and subsequently to the  floor.  POD #1 BP: 109/72 ; Pulse: 60 ; Temp: 97.5 F (36.4 C) ; Resp: 16  Patient reports pain as mild, controlled with medication. No events throughout the night. Little anxious about working with PT. Neurovascular intact, dorsiflexion/plantar flexion intact, incision: dressing C/D/I, no cellulitis present and compartment soft.   LABS  Basename    HGB  11.1  HCT  34.9   POD #2  BP: 127/85 ; Pulse: 73 ; Temp: 97.8 F (36.6 C) ; Resp: 16 Patient reports pain as mild, pain controlled. Complaining of not feeling right, dizzy and nauseous.Doesn't feel that she is doing well here in the hospital. She feels better that she has been able to urinate, but not quite right yet. Neurovascular intact, dorsiflexion/plantar flexion intact, incision: dressing C/D/I, no cellulitis present and compartment soft.   LABS  Basename    HGB  10.2  HCT  31.4   POD #3  BP: 106/68 ; Pulse: 91 ; Temp: 98.4 F (36.9 C) ;Resp: 14  Patient reports pain as mild, pain controlled. No events throughout the night. Ready to be discharged home. Neurovascular intact, dorsiflexion/plantar flexion intact, incision: dressing C/D/I, no cellulitis present and compartment soft.   LABS   No new labs  Discharge Exam: General appearance: alert, cooperative and no distress Extremities: Homans sign is negative, no sign of DVT, no edema, redness or tenderness in the calves or thighs and no ulcers, gangrene or trophic changes  Disposition: Home with follow up in 2 weeks   Follow-up Information   Follow up with Shelda PalLIN,Ivis Nicolson D, MD. Schedule an appointment as soon as possible for a visit  in 2 weeks.   Specialty:  Orthopedic Surgery   Contact information:   [address] [place] [id number] [phone]       Discharge Instructions   Call MD / Call 911    Complete by:  As directed   If you experience chest pain or shortness of breath, CALL 911 and be transported to the hospital emergency room.  If you  develope a fever above 101 F, pus (white drainage) or increased drainage or redness at the wound, or calf pain, call your surgeon's office.     Change dressing    Complete by:  As directed   Maintain surgical dressing for 10-14 days, or until follow up in the clinic.     Constipation Prevention    Complete by:  As directed   Drink plenty of fluids.  Prune juice may be helpful.  You may use a stool softener, such as Colace (over the counter) 100 mg twice a day.  Use MiraLax (over the counter) for constipation as needed.     Diet - low sodium heart healthy    Complete by:  As directed      Discharge instructions    Complete by:  As directed   Maintain surgical dressing for 10-14 days, or until follow up in the clinic. [facility]. Call with any questions or concerns.     Increase activity slowly as tolerated    Complete by:  As directed      TED hose    Complete by:  As directed   Use stockings (TED hose) for 2 weeks on both leg(s).  You may remove them at night for sleeping.     Weight bearing as tolerated    Complete by:  As directed   Laterality:  left  Extremity:  Lower             Medication List    STOP taking these medications       meloxicam 15 MG tablet  Commonly known as:  MOBIC      TAKE these medications       aspirin 325 MG EC tablet  Take 1 tablet (325 mg total) by mouth 2 (two) times daily.     DSS 100 MG Caps  Take 100 mg by mouth 2 (two) times daily.     ferrous sulfate 325 (65 FE) MG tablet  Take 1 tablet (325 mg total) by mouth 3 (three) times daily after meals.     HYDROcodone-acetaminophen 7.5-325 MG per tablet  Commonly known as:  NORCO  Take 1-2 tablets by mouth every 4 (four) hours as needed for moderate pain.     lisinopril-hydrochlorothiazide 10-12.5 MG per tablet  Commonly known as:  PRINZIDE,ZESTORETIC  Take 1 tablet by mouth every morning.     methocarbamol 500 MG tablet  Commonly known as:  ROBAXIN   Take 1 tablet (500 mg total) by mouth every 6 (six) hours as needed for muscle spasms.     multivitamin with minerals Tabs tablet  Take 1 tablet by mouth daily.     polyethylene glycol packet  Commonly known as:  MIRALAX / GLYCOLAX  Take 17 g by mouth 2 (two) times daily.     promethazine 12.5 MG tablet  Commonly known as:  PHENERGAN  Take 1 tablet (12.5 mg total) by mouth every 6 (six) hours as needed for nausea or vomiting.  Signed: Anastasio Auerbach. Shayla Heming   PA-C  01/23/2014, 12:28 AM

## 2014-07-29 ENCOUNTER — Inpatient Hospital Stay (HOSPITAL_COMMUNITY)
Admission: EM | Admit: 2014-07-29 | Discharge: 2014-08-12 | DRG: 493 | Disposition: A | Discharge status: Rehabilitation Facility | Payer: BC Managed Care – PPO | Attending: Surgery | Admitting: Surgery

## 2014-07-29 ENCOUNTER — Encounter (HOSPITAL_COMMUNITY): Payer: Self-pay | Admitting: Emergency Medicine

## 2014-07-29 ENCOUNTER — Emergency Department (HOSPITAL_COMMUNITY): Payer: BC Managed Care – PPO

## 2014-07-29 ENCOUNTER — Encounter (HOSPITAL_COMMUNITY): Admission: EM | Disposition: A | Discharge status: Rehabilitation Facility | Payer: Self-pay | Source: Home / Self Care

## 2014-07-29 ENCOUNTER — Inpatient Hospital Stay (HOSPITAL_COMMUNITY): Payer: BC Managed Care – PPO

## 2014-07-29 ENCOUNTER — Emergency Department (HOSPITAL_COMMUNITY): Payer: BC Managed Care – PPO | Admitting: Certified Registered Nurse Anesthetist

## 2014-07-29 DIAGNOSIS — R402362 Coma scale, best motor response, obeys commands, at arrival to emergency department: Secondary | ICD-10-CM | POA: Diagnosis present

## 2014-07-29 DIAGNOSIS — F4310 Post-traumatic stress disorder, unspecified: Secondary | ICD-10-CM | POA: Diagnosis not present

## 2014-07-29 DIAGNOSIS — S82891A Other fracture of right lower leg, initial encounter for closed fracture: Secondary | ICD-10-CM

## 2014-07-29 DIAGNOSIS — S2232XA Fracture of one rib, left side, initial encounter for closed fracture: Secondary | ICD-10-CM | POA: Diagnosis present

## 2014-07-29 DIAGNOSIS — S82141D Displaced bicondylar fracture of right tibia, subsequent encounter for closed fracture with routine healing: Secondary | ICD-10-CM | POA: Diagnosis not present

## 2014-07-29 DIAGNOSIS — S301XXA Contusion of abdominal wall, initial encounter: Secondary | ICD-10-CM | POA: Diagnosis present

## 2014-07-29 DIAGNOSIS — D72829 Elevated white blood cell count, unspecified: Secondary | ICD-10-CM | POA: Diagnosis not present

## 2014-07-29 DIAGNOSIS — K5900 Constipation, unspecified: Secondary | ICD-10-CM | POA: Diagnosis not present

## 2014-07-29 DIAGNOSIS — S82831D Other fracture of upper and lower end of right fibula, subsequent encounter for closed fracture with routine healing: Secondary | ICD-10-CM | POA: Diagnosis not present

## 2014-07-29 DIAGNOSIS — S060X0A Concussion without loss of consciousness, initial encounter: Secondary | ICD-10-CM | POA: Diagnosis present

## 2014-07-29 DIAGNOSIS — R402252 Coma scale, best verbal response, oriented, at arrival to emergency department: Secondary | ICD-10-CM | POA: Diagnosis present

## 2014-07-29 DIAGNOSIS — S8251XD Displaced fracture of medial malleolus of right tibia, subsequent encounter for closed fracture with routine healing: Secondary | ICD-10-CM | POA: Diagnosis present

## 2014-07-29 DIAGNOSIS — F419 Anxiety disorder, unspecified: Secondary | ICD-10-CM | POA: Diagnosis not present

## 2014-07-29 DIAGNOSIS — S93431A Sprain of tibiofibular ligament of right ankle, initial encounter: Secondary | ICD-10-CM | POA: Diagnosis present

## 2014-07-29 DIAGNOSIS — S82831A Other fracture of upper and lower end of right fibula, initial encounter for closed fracture: Secondary | ICD-10-CM | POA: Diagnosis present

## 2014-07-29 DIAGNOSIS — S2220XA Unspecified fracture of sternum, initial encounter for closed fracture: Secondary | ICD-10-CM

## 2014-07-29 DIAGNOSIS — S92312D Displaced fracture of first metatarsal bone, left foot, subsequent encounter for fracture with routine healing: Secondary | ICD-10-CM | POA: Diagnosis not present

## 2014-07-29 DIAGNOSIS — I10 Essential (primary) hypertension: Secondary | ICD-10-CM | POA: Diagnosis present

## 2014-07-29 DIAGNOSIS — S8251XB Displaced fracture of medial malleolus of right tibia, initial encounter for open fracture type I or II: Principal | ICD-10-CM | POA: Diagnosis present

## 2014-07-29 DIAGNOSIS — S82141A Displaced bicondylar fracture of right tibia, initial encounter for closed fracture: Secondary | ICD-10-CM

## 2014-07-29 DIAGNOSIS — Z79899 Other long term (current) drug therapy: Secondary | ICD-10-CM | POA: Diagnosis present

## 2014-07-29 DIAGNOSIS — T148XXA Other injury of unspecified body region, initial encounter: Secondary | ICD-10-CM

## 2014-07-29 DIAGNOSIS — D500 Iron deficiency anemia secondary to blood loss (chronic): Secondary | ICD-10-CM | POA: Diagnosis present

## 2014-07-29 DIAGNOSIS — Z87891 Personal history of nicotine dependence: Secondary | ICD-10-CM

## 2014-07-29 DIAGNOSIS — Z96652 Presence of left artificial knee joint: Secondary | ICD-10-CM | POA: Diagnosis present

## 2014-07-29 DIAGNOSIS — Z7982 Long term (current) use of aspirin: Secondary | ICD-10-CM | POA: Diagnosis not present

## 2014-07-29 DIAGNOSIS — S92312A Displaced fracture of first metatarsal bone, left foot, initial encounter for closed fracture: Secondary | ICD-10-CM | POA: Diagnosis present

## 2014-07-29 DIAGNOSIS — R402142 Coma scale, eyes open, spontaneous, at arrival to emergency department: Secondary | ICD-10-CM | POA: Diagnosis present

## 2014-07-29 DIAGNOSIS — Z419 Encounter for procedure for purposes other than remedying health state, unspecified: Secondary | ICD-10-CM

## 2014-07-29 DIAGNOSIS — S2232XS Fracture of one rib, left side, sequela: Secondary | ICD-10-CM | POA: Diagnosis not present

## 2014-07-29 DIAGNOSIS — Z96659 Presence of unspecified artificial knee joint: Secondary | ICD-10-CM

## 2014-07-29 DIAGNOSIS — M79672 Pain in left foot: Secondary | ICD-10-CM

## 2014-07-29 DIAGNOSIS — S82141S Displaced bicondylar fracture of right tibia, sequela: Secondary | ICD-10-CM | POA: Diagnosis not present

## 2014-07-29 DIAGNOSIS — E669 Obesity, unspecified: Secondary | ICD-10-CM | POA: Diagnosis present

## 2014-07-29 DIAGNOSIS — D62 Acute posthemorrhagic anemia: Secondary | ICD-10-CM | POA: Diagnosis not present

## 2014-07-29 DIAGNOSIS — S82891B Other fracture of right lower leg, initial encounter for open fracture type I or II: Secondary | ICD-10-CM | POA: Diagnosis present

## 2014-07-29 DIAGNOSIS — S2239XA Fracture of one rib, unspecified side, initial encounter for closed fracture: Secondary | ICD-10-CM

## 2014-07-29 DIAGNOSIS — S92902A Unspecified fracture of left foot, initial encounter for closed fracture: Secondary | ICD-10-CM | POA: Diagnosis present

## 2014-07-29 DIAGNOSIS — S82891S Other fracture of right lower leg, sequela: Secondary | ICD-10-CM | POA: Diagnosis not present

## 2014-07-29 HISTORY — PX: EXTERNAL FIXATION LEG: SHX1549

## 2014-07-29 LAB — URINALYSIS, ROUTINE W REFLEX MICROSCOPIC
Bilirubin Urine: NEGATIVE
Glucose, UA: NEGATIVE mg/dL
HGB URINE DIPSTICK: NEGATIVE
Ketones, ur: NEGATIVE mg/dL
Nitrite: NEGATIVE
Protein, ur: NEGATIVE mg/dL
SPECIFIC GRAVITY, URINE: 1.028 (ref 1.005–1.030)
Urobilinogen, UA: 0.2 mg/dL (ref 0.0–1.0)
pH: 5 (ref 5.0–8.0)

## 2014-07-29 LAB — COMPREHENSIVE METABOLIC PANEL
ALBUMIN: 3.5 g/dL (ref 3.5–5.2)
ALK PHOS: 59 U/L (ref 39–117)
ALT: 13 U/L (ref 0–35)
AST: 21 U/L (ref 0–37)
Anion gap: 9 (ref 5–15)
BUN: 15 mg/dL (ref 6–23)
CALCIUM: 9.1 mg/dL (ref 8.4–10.5)
CHLORIDE: 108 meq/L (ref 96–112)
CO2: 24 mmol/L (ref 19–32)
Creatinine, Ser: 0.75 mg/dL (ref 0.50–1.10)
GFR calc Af Amer: >90 mL/min (ref >90)
GFR calc non Af Amer: 90 mL/min — ABNORMAL LOW (ref >90)
Glucose, Bld: 124 mg/dL — ABNORMAL HIGH (ref 70–99)
POTASSIUM: 3.4 mmol/L — AB (ref 3.5–5.1)
SODIUM: 141 mmol/L (ref 135–145)
Total Bilirubin: 0.5 mg/dL (ref 0.3–1.2)
Total Protein: 6.8 g/dL (ref 6.0–8.3)

## 2014-07-29 LAB — SAMPLE TO BLOOD BANK

## 2014-07-29 LAB — ETHANOL

## 2014-07-29 LAB — CBC
HCT: 38.1 % (ref 36.0–46.0)
Hemoglobin: 11.7 g/dL — ABNORMAL LOW (ref 12.0–15.0)
MCH: 26.8 pg (ref 26.0–34.0)
MCHC: 30.7 g/dL (ref 30.0–36.0)
MCV: 87.2 fL (ref 78.0–100.0)
Platelets: 333 10*3/uL (ref 150–400)
RBC: 4.37 MIL/uL (ref 3.87–5.11)
RDW: 14.9 % (ref 11.5–15.5)
WBC: 7.2 10*3/uL (ref 4.0–10.5)

## 2014-07-29 LAB — PROTIME-INR
INR: 1.01 (ref 0.00–1.49)
Prothrombin Time: 13.4 seconds (ref 11.6–15.2)

## 2014-07-29 LAB — CDS SEROLOGY

## 2014-07-29 LAB — URINE MICROSCOPIC-ADD ON

## 2014-07-29 SURGERY — EXTERNAL FIXATION, LOWER EXTREMITY
Anesthesia: General | Site: Ankle | Laterality: Right

## 2014-07-29 MED ORDER — CEFAZOLIN SODIUM-DEXTROSE 2-3 GM-% IV SOLR
2.0000 g | Freq: Four times a day (QID) | INTRAVENOUS | Status: AC
  Administered 2014-07-29 – 2014-07-30 (×3): 2 g via INTRAVENOUS
  Filled 2014-07-29 (×4): qty 50

## 2014-07-29 MED ORDER — NEOSTIGMINE METHYLSULFATE 10 MG/10ML IV SOLN
INTRAVENOUS | Status: AC
  Filled 2014-07-29: qty 1

## 2014-07-29 MED ORDER — GLYCOPYRROLATE 0.2 MG/ML IJ SOLN
INTRAMUSCULAR | Status: DC | PRN
  Administered 2014-07-29: .8 mg via INTRAVENOUS

## 2014-07-29 MED ORDER — LIDOCAINE HCL (CARDIAC) 20 MG/ML IV SOLN
INTRAVENOUS | Status: DC | PRN
  Administered 2014-07-29: 80 mg via INTRAVENOUS

## 2014-07-29 MED ORDER — LACTATED RINGERS IV SOLN
INTRAVENOUS | Status: DC | PRN
  Administered 2014-07-29 (×2): via INTRAVENOUS

## 2014-07-29 MED ORDER — TETANUS-DIPHTH-ACELL PERTUSSIS 5-2.5-18.5 LF-MCG/0.5 IM SUSP
0.5000 mL | Freq: Once | INTRAMUSCULAR | Status: AC
  Administered 2014-07-29: 0.5 mL via INTRAMUSCULAR
  Filled 2014-07-29: qty 0.5

## 2014-07-29 MED ORDER — ROCURONIUM BROMIDE 50 MG/5ML IV SOLN
INTRAVENOUS | Status: AC
  Filled 2014-07-29: qty 1

## 2014-07-29 MED ORDER — LIDOCAINE HCL (CARDIAC) 20 MG/ML IV SOLN
INTRAVENOUS | Status: AC
  Filled 2014-07-29: qty 5

## 2014-07-29 MED ORDER — NEOSTIGMINE METHYLSULFATE 10 MG/10ML IV SOLN
INTRAVENOUS | Status: DC | PRN
  Administered 2014-07-29: 5 mg via INTRAVENOUS

## 2014-07-29 MED ORDER — HYDROMORPHONE HCL 1 MG/ML IJ SOLN
0.5000 mg | INTRAMUSCULAR | Status: DC | PRN
  Administered 2014-07-29 – 2014-08-11 (×5): 0.5 mg via INTRAVENOUS
  Filled 2014-07-29 (×5): qty 1

## 2014-07-29 MED ORDER — PANTOPRAZOLE SODIUM 40 MG IV SOLR
40.0000 mg | Freq: Every day | INTRAVENOUS | Status: DC
  Filled 2014-07-29: qty 40

## 2014-07-29 MED ORDER — FENTANYL CITRATE 0.05 MG/ML IJ SOLN
INTRAMUSCULAR | Status: DC | PRN
  Administered 2014-07-29 (×2): 50 ug via INTRAVENOUS

## 2014-07-29 MED ORDER — IOHEXOL 300 MG/ML  SOLN: 100.0000 mL | Freq: Once | INTRAMUSCULAR | Status: AC | PRN

## 2014-07-29 MED ORDER — GLYCOPYRROLATE 0.2 MG/ML IJ SOLN
INTRAMUSCULAR | Status: AC
  Filled 2014-07-29: qty 4

## 2014-07-29 MED ORDER — ONDANSETRON HCL 4 MG/2ML IJ SOLN: 4.0000 mg | Freq: Four times a day (QID) | INTRAMUSCULAR | Status: DC | PRN

## 2014-07-29 MED ORDER — ONDANSETRON HCL 4 MG/2ML IJ SOLN
4.0000 mg | Freq: Once | INTRAMUSCULAR | Status: AC
  Administered 2014-07-29: 4 mg via INTRAVENOUS
  Filled 2014-07-29: qty 2

## 2014-07-29 MED ORDER — ONDANSETRON HCL 4 MG/2ML IJ SOLN
INTRAMUSCULAR | Status: AC
  Filled 2014-07-29: qty 2

## 2014-07-29 MED ORDER — HYDROMORPHONE HCL 1 MG/ML IJ SOLN
1.0000 mg | Freq: Once | INTRAMUSCULAR | Status: AC
  Administered 2014-07-29: 1 mg via INTRAVENOUS
  Filled 2014-07-29: qty 1

## 2014-07-29 MED ORDER — DOCUSATE SODIUM 100 MG PO CAPS
100.0000 mg | ORAL_CAPSULE | Freq: Two times a day (BID) | ORAL | Status: DC
  Administered 2014-07-29 – 2014-08-12 (×27): 100 mg via ORAL
  Filled 2014-07-29 (×25): qty 1

## 2014-07-29 MED ORDER — SODIUM CHLORIDE 0.9 % IR SOLN
Status: DC | PRN
  Administered 2014-07-29: 3000 mL

## 2014-07-29 MED ORDER — MIDAZOLAM HCL 5 MG/5ML IJ SOLN
INTRAMUSCULAR | Status: DC | PRN
  Administered 2014-07-29: 2 mg via INTRAVENOUS

## 2014-07-29 MED ORDER — SODIUM CHLORIDE 0.9 % IV BOLUS (SEPSIS)
1000.0000 mL | INTRAVENOUS | Status: AC
  Administered 2014-07-29: 1000 mL via INTRAVENOUS

## 2014-07-29 MED ORDER — FENTANYL CITRATE 0.05 MG/ML IJ SOLN
INTRAMUSCULAR | Status: AC
  Filled 2014-07-29: qty 5

## 2014-07-29 MED ORDER — CEFAZOLIN SODIUM-DEXTROSE 2-3 GM-% IV SOLR
INTRAVENOUS | Status: DC | PRN
  Administered 2014-07-29: 2 g via INTRAVENOUS

## 2014-07-29 MED ORDER — DEXTROSE-NACL 5-0.45 % IV SOLN: 100.0000 mL/h | INTRAVENOUS | Status: DC

## 2014-07-29 MED ORDER — ACETAMINOPHEN 500 MG PO TABS
1000.0000 mg | ORAL_TABLET | Freq: Once | ORAL | Status: AC
  Administered 2014-07-29: 1000 mg via ORAL
  Filled 2014-07-29: qty 2

## 2014-07-29 MED ORDER — CEFAZOLIN SODIUM-DEXTROSE 2-3 GM-% IV SOLR
2.0000 g | INTRAVENOUS | Status: AC
  Filled 2014-07-29: qty 50

## 2014-07-29 MED ORDER — HYDROMORPHONE HCL 1 MG/ML IJ SOLN
0.2500 mg | INTRAMUSCULAR | Status: DC | PRN
  Administered 2014-07-29 (×2): 0.5 mg via INTRAVENOUS

## 2014-07-29 MED ORDER — ONDANSETRON HCL 4 MG PO TABS
4.0000 mg | ORAL_TABLET | Freq: Four times a day (QID) | ORAL | Status: DC | PRN
  Administered 2014-08-02: 4 mg via ORAL
  Filled 2014-07-29: qty 1

## 2014-07-29 MED ORDER — PROPOFOL 10 MG/ML IV BOLUS
INTRAVENOUS | Status: AC
  Filled 2014-07-29: qty 20

## 2014-07-29 MED ORDER — PROPOFOL 10 MG/ML IV BOLUS
INTRAVENOUS | Status: DC | PRN
  Administered 2014-07-29: 20 mg via INTRAVENOUS
  Administered 2014-07-29: 150 mg via INTRAVENOUS

## 2014-07-29 MED ORDER — PANTOPRAZOLE SODIUM 40 MG PO TBEC
40.0000 mg | DELAYED_RELEASE_TABLET | Freq: Every day | ORAL | Status: DC
  Administered 2014-07-29: 40 mg via ORAL
  Filled 2014-07-29: qty 1

## 2014-07-29 MED ORDER — HYDROMORPHONE HCL 1 MG/ML IJ SOLN
INTRAMUSCULAR | Status: AC
  Filled 2014-07-29: qty 1

## 2014-07-29 MED ORDER — ENOXAPARIN SODIUM 40 MG/0.4ML ~~LOC~~ SOLN
40.0000 mg | SUBCUTANEOUS | Status: DC
  Administered 2014-07-29: 40 mg via SUBCUTANEOUS
  Filled 2014-07-29 (×2): qty 0.4

## 2014-07-29 MED ORDER — ONDANSETRON HCL 4 MG/2ML IJ SOLN
INTRAMUSCULAR | Status: DC | PRN
  Administered 2014-07-29: 4 mg via INTRAVENOUS

## 2014-07-29 MED ORDER — POTASSIUM CHLORIDE IN NACL 20-0.45 MEQ/L-% IV SOLN
INTRAVENOUS | Status: DC
  Administered 2014-07-29: 17:00:00 via INTRAVENOUS
  Filled 2014-07-29 (×2): qty 1000

## 2014-07-29 MED ORDER — CEFAZOLIN SODIUM-DEXTROSE 2-3 GM-% IV SOLR
2.0000 g | Freq: Once | INTRAVENOUS | Status: AC
  Administered 2014-07-29: 2 g via INTRAVENOUS
  Filled 2014-07-29: qty 50

## 2014-07-29 MED ORDER — OXYCODONE HCL 5 MG PO TABS
5.0000 mg | ORAL_TABLET | ORAL | Status: DC | PRN
  Administered 2014-07-29 – 2014-08-05 (×14): 15 mg via ORAL
  Administered 2014-08-06: 5 mg via ORAL
  Administered 2014-08-07 – 2014-08-12 (×13): 15 mg via ORAL
  Filled 2014-07-29 (×9): qty 3
  Filled 2014-07-29: qty 1
  Filled 2014-07-29 (×21): qty 3

## 2014-07-29 MED ORDER — ROCURONIUM BROMIDE 100 MG/10ML IV SOLN
INTRAVENOUS | Status: DC | PRN
  Administered 2014-07-29: 50 mg via INTRAVENOUS

## 2014-07-29 MED ORDER — MIDAZOLAM HCL 2 MG/2ML IJ SOLN
INTRAMUSCULAR | Status: AC
  Filled 2014-07-29: qty 2

## 2014-07-29 MED ORDER — ONDANSETRON HCL 4 MG/2ML IJ SOLN
4.0000 mg | Freq: Four times a day (QID) | INTRAMUSCULAR | Status: DC | PRN
  Administered 2014-07-29 – 2014-08-11 (×2): 4 mg via INTRAVENOUS
  Filled 2014-07-29 (×2): qty 2

## 2014-07-29 MED ORDER — MORPHINE SULFATE 4 MG/ML IJ SOLN
4.0000 mg | Freq: Once | INTRAMUSCULAR | Status: AC
  Administered 2014-07-29: 4 mg via INTRAVENOUS
  Filled 2014-07-29: qty 1

## 2014-07-29 SURGICAL SUPPLY — 66 items
BANDAGE ELASTIC 4 VELCRO ST LF (GAUZE/BANDAGES/DRESSINGS) ×3 IMPLANT
BANDAGE ELASTIC 6 VELCRO ST LF (GAUZE/BANDAGES/DRESSINGS) ×3 IMPLANT
BANDAGE ESMARK 6X9 LF (GAUZE/BANDAGES/DRESSINGS) ×1 IMPLANT
BNDG COHESIVE 6X5 TAN STRL LF (GAUZE/BANDAGES/DRESSINGS) ×3 IMPLANT
BNDG ESMARK 6X9 LF (GAUZE/BANDAGES/DRESSINGS) ×3
BNDG GAUZE ELAST 4 BULKY (GAUZE/BANDAGES/DRESSINGS) ×3 IMPLANT
CLEANER TIP ELECTROSURG 2X2 (MISCELLANEOUS) ×3 IMPLANT
COVER SURGICAL LIGHT HANDLE (MISCELLANEOUS) ×3 IMPLANT
CUFF TOURNIQUET SINGLE 18IN (TOURNIQUET CUFF) IMPLANT
CUFF TOURNIQUET SINGLE 24IN (TOURNIQUET CUFF) IMPLANT
CUFF TOURNIQUET SINGLE 34IN LL (TOURNIQUET CUFF) IMPLANT
DRAPE C-ARM 42X72 X-RAY (DRAPES) IMPLANT
DRAPE C-ARMOR (DRAPES) ×3 IMPLANT
DRAPE U-SHAPE 47X51 STRL (DRAPES) ×3 IMPLANT
DRSG ADAPTIC 3X8 NADH LF (GAUZE/BANDAGES/DRESSINGS) ×3 IMPLANT
ELECT REM PT RETURN 9FT ADLT (ELECTROSURGICAL) ×3
ELECTRODE REM PT RTRN 9FT ADLT (ELECTROSURGICAL) ×1 IMPLANT
EVACUATOR 1/8 PVC DRAIN (DRAIN) IMPLANT
GAUZE SPONGE 4X4 12PLY STRL (GAUZE/BANDAGES/DRESSINGS) ×3 IMPLANT
GAUZE XEROFORM 5X9 LF (GAUZE/BANDAGES/DRESSINGS) ×3 IMPLANT
GLOVE BIO SURGEON STRL SZ7 (GLOVE) ×3 IMPLANT
GLOVE BIOGEL PI IND STRL 8 (GLOVE) ×1 IMPLANT
GLOVE BIOGEL PI INDICATOR 8 (GLOVE) ×2
GLOVE ORTHO TXT STRL SZ7.5 (GLOVE) ×3 IMPLANT
GLOVE SURG ORTHO 8.0 STRL STRW (GLOVE) ×6 IMPLANT
GOWN STRL REUS W/ TWL LRG LVL3 (GOWN DISPOSABLE) ×1 IMPLANT
GOWN STRL REUS W/TWL LRG LVL3 (GOWN DISPOSABLE) ×2
HANDPIECE INTERPULSE COAX TIP (DISPOSABLE)
KIT BASIN OR (CUSTOM PROCEDURE TRAY) ×3 IMPLANT
KIT ROOM TURNOVER OR (KITS) ×3 IMPLANT
MANIFOLD NEPTUNE II (INSTRUMENTS) ×3 IMPLANT
NEEDLE 22X1 1/2 (OR ONLY) (NEEDLE) IMPLANT
NS IRRIG 1000ML POUR BTL (IV SOLUTION) ×3 IMPLANT
PACK ORTHO EXTREMITY (CUSTOM PROCEDURE TRAY) ×3 IMPLANT
PAD ARMBOARD 7.5X6 YLW CONV (MISCELLANEOUS) ×6 IMPLANT
PADDING CAST COTTON 6X4 STRL (CAST SUPPLIES) ×9 IMPLANT
PIN APEX 5X150 (EXFIX) ×6 IMPLANT
PIN APEX 5X180MM EXFIX (EXFIX) ×6 IMPLANT
PIN CLAMP 5H 30DEG POST (EXFIX) ×6 IMPLANT
PIN CLAMP 5H DIA 4X5X6M (EXFIX) ×6 IMPLANT
PIN TRANSFIX 615X300 EXFIX (EXFIX) ×6 IMPLANT
POST 30DEG ANGLED DIA 11M (EXFIX) ×6 IMPLANT
ROD CARBON (EXFIX) ×4
ROD CNCT 400X11XNS LF HFMN (EXFIX) ×2 IMPLANT
ROD HOFFMANN3 CONNECT 11X500 (EXFIX) ×6 IMPLANT
ROD TO ROD COUPLING EXFIX (EXFIX) ×24 IMPLANT
SET CYSTO W/LG BORE CLAMP LF (SET/KITS/TRAYS/PACK) ×3 IMPLANT
SET HNDPC FAN SPRY TIP SCT (DISPOSABLE) IMPLANT
SPONGE LAP 18X18 X RAY DECT (DISPOSABLE) ×6 IMPLANT
SPONGE SCRUB IODOPHOR (GAUZE/BANDAGES/DRESSINGS) ×3 IMPLANT
STAPLER VISISTAT 35W (STAPLE) IMPLANT
STOCKINETTE IMPERVIOUS LG (DRAPES) ×3 IMPLANT
SUCTION FRAZIER TIP 10 FR DISP (SUCTIONS) IMPLANT
SUT ETHILON 3 0 PS 1 (SUTURE) ×3 IMPLANT
SUT VIC AB 0 CT1 27 (SUTURE) ×4
SUT VIC AB 0 CT1 27XBRD ANBCTR (SUTURE) ×2 IMPLANT
SUT VIC AB 2-0 CT1 27 (SUTURE) ×4
SUT VIC AB 2-0 CT1 TAPERPNT 27 (SUTURE) ×2 IMPLANT
SYR CONTROL 10ML LL (SYRINGE) IMPLANT
TOWEL OR 17X24 6PK STRL BLUE (TOWEL DISPOSABLE) ×6 IMPLANT
TOWEL OR 17X26 10 PK STRL BLUE (TOWEL DISPOSABLE) ×6 IMPLANT
TUBE CONNECTING 12'X1/4 (SUCTIONS) ×1
TUBE CONNECTING 12X1/4 (SUCTIONS) ×2 IMPLANT
UNDERPAD 30X30 INCONTINENT (UNDERPADS AND DIAPERS) ×3 IMPLANT
WATER STERILE IRR 1000ML POUR (IV SOLUTION) ×6 IMPLANT
YANKAUER SUCT BULB TIP NO VENT (SUCTIONS) ×3 IMPLANT
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
## 2014-07-29 NOTE — ED Notes (Signed)
PT purse given to daughter.

## 2014-07-29 NOTE — ED Notes (Signed)
Optho Surgery at bedside  2 rings give to daughter.
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
## 2014-07-29 NOTE — ED Notes (Signed)
MD at bedside, with family.

## 2014-07-29 NOTE — Op Note (Signed)
07/29/2014  1:07 PM  PATIENT:  Sherry Gutierrez    PRE-OPERATIVE DIAGNOSIS:  right ankle tib/fib fracture   POST-OPERATIVE DIAGNOSIS:  Same  PROCEDURE:  EXTERNAL FIXATION LEG  SURGEON:  Sashia Campas, D, MD  ASSISTANT: Janace LittenBrandon Parry, OPA, He was necessary for efficiency and safety of the case.   ANESTHESIA:   gen  PREOPERATIVE INDICATIONS:  Sherry Gutierrez is a  64 y.o. female with a diagnosis of right ankle tib/fib fracture  who failed conservative measures and elected for surgical management.    The risks benefits and alternatives were discussed with the patient preoperatively including but not limited to the risks of infection, bleeding, nerve injury, cardiopulmonary complications, the need for revision surgery, among others, and the patient was willing to proceed.  OPERATIVE IMPLANTS: Ex fix [employer]  OPERATIVE FINDINGS: unstable fracture at the ankle  BLOOD LOSS: min  COMPLICATIONS: none  TOURNIQUET TIME: noe  OPERATIVE PROCEDURE:  Patient was identified in the preoperative holding area and site was marked by me She was transported to the operating theater and placed on the table in supine position taking care to pad all bony prominences. After a preincinduction time out anesthesia was induced. The right lower extremity was prepped and draped in normal sterile fashion and a pre-incision timeout was performed. She received ancef for preoperative antibiotics.   Initially debrided and irrigated her open fracture delivering both bone ends of the medial side where she broke her medial Mal. I then thoroughly irrigated this with 4 L of normal saline. There was no gross contamination.  Next I closed this medial wound with a running simple nylon stitches.  Next I turned my attention to the external fixators.  I placed pins A to P Hunter femur superior to her joint capsule for her knee and admitted her tibia staying out of proposed area for plate fixation for her plateau. I then  also placed 2 transfix pins in her calcaneus under direct visualization with fluoroscopy.  I then built a frame spanning her knee joint as well as her ankle I reduced her knee and secured this into place. I then reduced her ankle and secured this into place as well I took multiple x-rays was happy with the placement of the ex-fix the reduction of her fractures. I then placed sterile dressings and a compressive wrap. Her compartments were very soft at this point side elected to not perform a fasciotomy. PACU in stable condition.  POST OPERATIVE PLAN: NWB RLE. Definitive fixation next week TBD    This note was generated using a template and dragon dictation system. In light of that, I have reviewed the note and all aspects of it are applicable to this case. Any dictation errors are due to the computerized dictation system.

## 2014-07-29 NOTE — ED Notes (Signed)
Patient transported to CT 

## 2014-07-29 NOTE — Anesthesia Postprocedure Evaluation (Signed)
Anesthesia Post Note  Patient: Sherry Gutierrez  Procedure(s) Performed: Procedure(s) (LRB): EXTERNAL FIXATION LEG (Right)  Anesthesia type: General  Patient location: PACU  Post pain: Pain level controlled and Adequate analgesia  Post assessment: Post-op Vital signs reviewed, Patient's Cardiovascular Status Stable, Respiratory Function Stable, Patent Airway and Pain level controlled  Last Vitals:  Filed Vitals:   07/29/14 1421  BP:   Pulse: 68  Temp: 36.5 C  Resp: 18    Post vital signs: Reviewed and stable  Level of consciousness: awake, alert  and oriented  Complications: No apparent anesthesia complications
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
## 2014-07-29 NOTE — Anesthesia Preprocedure Evaluation (Addendum)
Anesthesia Evaluation  Patient identified by MRN, date of birth, ID band Patient awake    Reviewed: Allergy & Precautions, NPO status , Patient's Chart, lab work & pertinent test results  Airway Mallampati: II  TM Distance: >3 FB Neck ROM: full    Dental  (+) Dental Advisory Given, Teeth Intact   Pulmonary former smoker,          Cardiovascular hypertension, Pt. on medications     Neuro/Psych    GI/Hepatic   Endo/Other  Morbid obesityobese  Renal/GU      Musculoskeletal  (+) Arthritis -,   Abdominal   Peds  Hematology   Anesthesia Other Findings   Reproductive/Obstetrics                           Anesthesia Physical Anesthesia Plan  ASA: II and emergent  Anesthesia Plan: General   Post-op Pain Management:    Induction: Intravenous  Airway Management Planned: Oral ETT  Additional Equipment:   Intra-op Plan:   Post-operative Plan: Extubation in OR  Informed Consent: I have reviewed the patients History and Physical, chart, labs and discussed the procedure including the risks, benefits and alternatives for the proposed anesthesia with the patient or authorized representative who has indicated his/her understanding and acceptance.   Dental advisory given  Plan Discussed with: CRNA, Anesthesiologist and Surgeon  Anesthesia Plan Comments:         Anesthesia Quick Evaluation

## 2014-07-29 NOTE — Transfer of Care (Signed)
Immediate Anesthesia Transfer of Care Note  Patient: Sherry Gutierrez  Procedure(s) Performed: Procedure(s): EXTERNAL FIXATION LEG (Right)  Patient Location: PACU  Anesthesia Type:General  Level of Consciousness: awake, alert  and oriented  Airway & Oxygen Therapy: Patient Spontanous Breathing and Patient connected to nasal cannula oxygen  Post-op Assessment: Report given to PACU RN, Post -op Vital signs reviewed and stable and Patient moving all extremities X 4  Post vital signs: Reviewed and stable  Complications: No apparent anesthesia complications

## 2014-07-29 NOTE — ED Notes (Signed)
PT has Pedal pulse bilateral.

## 2014-07-29 NOTE — ED Notes (Signed)
Patient transported to X-ray 

## 2014-07-29 NOTE — Progress Notes (Signed)
   07/29/14 1004  Clinical Encounter Type  Visited With Family  Visit Type ED  Chaplain responded to page requesting help transitioning family to OR waiting area. Chaplain met pt's daughter, son-in-law, and son, and escorted them to [place] waiting area. They did not nee further chaplain support at this time. Page if needed.  Vanetta Mulders 07/29/2014 10:05 AM

## 2014-07-29 NOTE — Progress Notes (Deleted)
RLE

## 2014-07-29 NOTE — ED Notes (Signed)
PA at bedside.
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
## 2014-07-29 NOTE — Anesthesia Procedure Notes (Signed)
Procedure Name: Intubation Date/Time: 07/29/2014 11:26 AM Performed by: Rise PatienceBELL, Audryanna Zurita T Pre-anesthesia Checklist: Patient identified, Emergency Drugs available, Suction available and Patient being monitored Patient Re-evaluated:Patient Re-evaluated prior to inductionOxygen Delivery Method: Circle system utilized Preoxygenation: Pre-oxygenation with 100% oxygen Intubation Type: IV induction Ventilation: Mask ventilation without difficulty Laryngoscope Size: Miller and 2 Grade View: Grade I Tube type: Oral Tube size: 7.5 mm Number of attempts: 1 Airway Equipment and Method: Stylet Placement Confirmation: ETT inserted through vocal cords under direct vision,  positive ETCO2 and breath sounds checked- equal and bilateral Secured at: 21 cm Tube secured with: Tape Dental Injury: Teeth and Oropharynx as per pre-operative assessment

## 2014-07-30 ENCOUNTER — Inpatient Hospital Stay (HOSPITAL_COMMUNITY): Payer: BC Managed Care – PPO

## 2014-07-30 LAB — CBC
HCT: 32 % — ABNORMAL LOW (ref 36.0–46.0)
HEMOGLOBIN: 10.1 g/dL — AB (ref 12.0–15.0)
MCH: 27.2 pg (ref 26.0–34.0)
MCHC: 31.6 g/dL (ref 30.0–36.0)
MCV: 86 fL (ref 78.0–100.0)
Platelets: 254 10*3/uL (ref 150–400)
RBC: 3.72 MIL/uL — AB (ref 3.87–5.11)
RDW: 14.7 % (ref 11.5–15.5)
WBC: 6.2 10*3/uL (ref 4.0–10.5)

## 2014-07-30 LAB — BASIC METABOLIC PANEL
Anion gap: 5 (ref 5–15)
BUN: 7 mg/dL (ref 6–23)
CO2: 30 mmol/L (ref 19–32)
Calcium: 8.5 mg/dL (ref 8.4–10.5)
Chloride: 102 mEq/L (ref 96–112)
Creatinine, Ser: 0.66 mg/dL (ref 0.50–1.10)
GFR calc Af Amer: >90 mL/min (ref >90)
GFR calc non Af Amer: >90 mL/min (ref >90)
Glucose, Bld: 119 mg/dL — ABNORMAL HIGH (ref 70–99)
Potassium: 3.6 mmol/L (ref 3.5–5.1)
Sodium: 137 mmol/L (ref 135–145)

## 2014-07-30 MED ORDER — POLYETHYLENE GLYCOL 3350 17 G PO PACK
17.0000 g | PACK | Freq: Every day | ORAL | Status: DC
  Administered 2014-07-30 – 2014-08-12 (×12): 17 g via ORAL
  Filled 2014-07-30 (×16): qty 1

## 2014-07-30 MED ORDER — ENOXAPARIN SODIUM 30 MG/0.3ML ~~LOC~~ SOLN
30.0000 mg | Freq: Two times a day (BID) | SUBCUTANEOUS | Status: DC
  Administered 2014-07-30 – 2014-08-12 (×26): 30 mg via SUBCUTANEOUS
  Filled 2014-07-30 (×33): qty 0.3

## 2014-07-30 MED ORDER — ASPIRIN EC 81 MG PO TBEC
81.0000 mg | DELAYED_RELEASE_TABLET | Freq: Every day | ORAL | Status: DC
  Administered 2014-07-30 – 2014-08-12 (×13): 81 mg via ORAL
  Filled 2014-07-30 (×14): qty 1

## 2014-07-30 MED ORDER — LISINOPRIL 10 MG PO TABS
10.0000 mg | ORAL_TABLET | Freq: Every day | ORAL | Status: DC
  Administered 2014-07-30 – 2014-08-12 (×13): 10 mg via ORAL
  Filled 2014-07-30 (×14): qty 1

## 2014-07-30 MED ORDER — LISINOPRIL-HYDROCHLOROTHIAZIDE 10-12.5 MG PO TABS
1.0000 | ORAL_TABLET | Freq: Every morning | ORAL | Status: DC
Start: 2014-07-30 — End: 2014-07-30

## 2014-07-30 MED ORDER — HYDROCHLOROTHIAZIDE 12.5 MG PO CAPS
12.5000 mg | ORAL_CAPSULE | Freq: Every day | ORAL | Status: DC
  Administered 2014-07-30 – 2014-08-12 (×13): 12.5 mg via ORAL
  Filled 2014-07-30 (×14): qty 1

## 2014-07-30 MED ORDER — ACETAMINOPHEN 325 MG PO TABS
650.0000 mg | ORAL_TABLET | Freq: Four times a day (QID) | ORAL | Status: DC | PRN
Start: 2014-07-30 — End: 2014-08-12
  Administered 2014-07-30: 650 mg via ORAL
  Filled 2014-07-30: qty 2

## 2014-07-30 NOTE — Progress Notes (Signed)
Rehab Admissions Coordinator Note:  Patient was screened by Jaivion Kingsley L for appropriateness for an Inpatient Acute Rehab Consult.  At this time, we are recommending Inpatient Rehab consult.  Braysen Cloward L 07/30/2014, 12:38 PM  I can be reached at [phone].
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
## 2014-07-30 NOTE — Progress Notes (Signed)
UR completed.  Awaiting therapy evals to know pt's needs for d/c.   Carlyle LipaMichelle Ajanae Virag, RN BSN MHA CCM Trauma/Neuro ICU Case Manager [phone]
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
## 2014-07-31 LAB — CBC
HCT: 33.7 % — ABNORMAL LOW (ref 36.0–46.0)
Hemoglobin: 10.4 g/dL — ABNORMAL LOW (ref 12.0–15.0)
MCH: 26.6 pg (ref 26.0–34.0)
MCHC: 30.9 g/dL (ref 30.0–36.0)
MCV: 86.2 fL (ref 78.0–100.0)
Platelets: 236 10*3/uL (ref 150–400)
RBC: 3.91 MIL/uL (ref 3.87–5.11)
RDW: 14.8 % (ref 11.5–15.5)
WBC: 8 10*3/uL (ref 4.0–10.5)

## 2014-07-31 MED ORDER — DIPHENHYDRAMINE HCL 25 MG PO CAPS
25.0000 mg | ORAL_CAPSULE | Freq: Four times a day (QID) | ORAL | Status: DC | PRN
  Administered 2014-07-31 – 2014-08-12 (×3): 25 mg via ORAL
  Filled 2014-07-31 (×3): qty 1
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
## 2014-07-31 NOTE — Progress Notes (Signed)
Orthopedic Tech Progress Note Patient Details:  Sherry Gutierrez 22-Apr-1951 [id number]  Ortho Devices Type of Ortho Device: CAM walker Ortho Device/Splint Location: lle Ortho Device/Splint Interventions: Application   Darvin Dials 07/31/2014, 1:51 PM
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
## 2014-08-01 MED ORDER — METHOCARBAMOL 500 MG PO TABS
1000.0000 mg | ORAL_TABLET | Freq: Three times a day (TID) | ORAL | Status: DC
  Administered 2014-08-01 – 2014-08-12 (×33): 1000 mg via ORAL
  Filled 2014-08-01 (×40): qty 2
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
## 2014-08-02 ENCOUNTER — Inpatient Hospital Stay (HOSPITAL_COMMUNITY): Payer: BC Managed Care – PPO

## 2014-08-02 ENCOUNTER — Encounter (HOSPITAL_COMMUNITY): Payer: Self-pay | Admitting: Orthopedic Surgery

## 2014-08-02 DIAGNOSIS — S82891S Other fracture of right lower leg, sequela: Secondary | ICD-10-CM

## 2014-08-02 DIAGNOSIS — S82141S Displaced bicondylar fracture of right tibia, sequela: Secondary | ICD-10-CM

## 2014-08-02 DIAGNOSIS — S92902A Unspecified fracture of left foot, initial encounter for closed fracture: Secondary | ICD-10-CM | POA: Diagnosis present

## 2014-08-02 DIAGNOSIS — S2232XS Fracture of one rib, left side, sequela: Secondary | ICD-10-CM

## 2014-08-02 NOTE — Clinical Social Work Note (Signed)
Clinical Social Worker continuing to follow patient and family for support and discharge planning needs.  Per PA request, CSW spoke with inpatient rehab admissions coordinator regarding possible inpatient rehab admission prior to surgery and then return following surgery.  Inpatient rehab MD agreeable with consult and feel patient is a good candidate if family support and insurance approve.  CSW remains available for support and to facilitate patient discharge needs to SNF if deemed appropriate.  Macario GoldsJesse Rhythm Gubbels, [place] [phone]

## 2014-08-02 NOTE — Progress Notes (Signed)
We will do rehab consult prior to surgery to assess options for rehab prior to surgery pending BCBS authorization as well as rehab MD recommendations. I will follow up after consultation. [phone]

## 2014-08-02 NOTE — Progress Notes (Signed)
I will begin precertification for a possible inpt rehab admission pending BCBS approval. I have discussed with SW. [phone]
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
## 2014-08-03 NOTE — Progress Notes (Signed)
I await [employer] decision for rehab venue of inpt rehab vs SNF. I met with pt and her children and they are aware of issues. [phone]
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
## 2014-08-04 NOTE — Progress Notes (Signed)
PT Cancellation Note  Patient Details Name: Sherry Gutierrez MRN: [id number] DOB: 1950/09/27   Cancelled Treatment:    Reason Eval/Treat Not Completed: Patient at procedure or test/unavailable Pt declined participating in therapy this PM reporting she is anxious about being d/ced to SNF. Will follow up next available time.   Alvie HeidelbergFolan, Octavio Matheney A 08/04/2014, 1:56 PM Alvie HeidelbergShauna Folan, PT, DPT [phone]

## 2014-08-04 NOTE — Clinical Social Work Note (Signed)
CSW advised by CIR Rep that [employer] has denied CIR at this time. CSW has provided patient with SNF offers and now we are working to get [employer] auth for SNF transfer- CSW is also working on alternative SNF options including [facility] beds- will advise- patient aware and agreeable to plans as above.  Reece LevyJanet Laren Whaling, MSW, Theresia MajorsLCSWA [phone]
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
## 2014-08-04 NOTE — Clinical Social Work Note (Signed)
CSW has contacted [employer] multiple times today including a 1 hour hold to speak to a rep- CSW has just rec'd phone call from a [employer]BS rep indicating they will not approve CIR or SNF and request that "she remain in hospital until her surgery". I have advised the [employer]BS rep that she does not need acute care and she indicates this will require a Peer to Peer. CSW has contacted our Medical Director to ask for his assistance with this transaction.  CSW has advised patient of SNF offers and [facility] preference [name]- they have a bed and are willing to accept pending auth.     Reece LevyJanet Janelle Gutierrez, MSW, Theresia MajorsLCSWA [phone]
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
## 2014-08-04 NOTE — Clinical Documentation Improvement (Signed)
(  NOTE: Even though the fracture may be described using the terminology found in the Gustilo classification the provider must document the type of Gustilo fracture present; coder CANNOT code based on the Fracture Description)  ICD 10 requires specificity for open fractures of the lower leg using Gustilo Type l through Type lllC.  Please document the appropriate type of open fracture:  --TYPE l: The wound is smaller than 1 cm, clean, and generally caused by a fracture fragment that pierces the skin. --TYPE II: The wound is longer than 1 cm, not contaminated, and without major soft tissue damage or defect. This is also a low energy injury. --TYPE III: The wound is longer than 1 cm., with significant soft tissue disruption. The mechanism often involves high-energy trauma, resulting in a severely unstable fracture with varying degrees of fragmentation. --IIIA: The wound has sufficient soft tissue to cover the bone without the need for local or distant flap coverage. --IIIB: Disruption of the soft tissue is extensive, such that local or distant flap coverage is necessary to cover the bone. The wound may be contaminated, and serial irrigation and debridement procedures are necessary to ensure a clean surgical wound. --IIIC: Any open fracture associated with an arterial injury that requires repair is considered IIIC. Involvement of a vascular surgeon is generally required.   Thank You,  Mathis Dadathy Daleen Steinhaus RN BSN CCDS [phone] [facility] Clinical Documentation Improvement
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
## 2014-08-04 NOTE — Progress Notes (Signed)
OT Cancellation Note  Patient Details Name: Francisca DecemberMarilyn G Covey MRN: [id number] DOB: [birth year]   Cancelled Treatment:    Reason Eval/Treat Not Completed: Pt anxious about pending discharge to SNF and refusing OT at this time.   Angelene GiovanniConarpe, Driana Dazey M  Zully Frane Conwayonarpe, OTR/L [phone]  08/04/2014, 12:42 PM

## 2014-08-05 LAB — CREATININE, SERUM
Creatinine, Ser: 0.71 mg/dL (ref 0.50–1.10)
GFR calc Af Amer: >90 mL/min (ref >90)
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
## 2014-08-05 NOTE — Progress Notes (Signed)
Please reconsult inpt rehab post op once PT and OT have reassessed her postoperatively. At that time we will assist in planning dispo. Pt is hopeful to go home with home health after external fixator is removed. [phone]
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
## 2014-08-05 NOTE — Progress Notes (Signed)
UR completed.  Zendaya Groseclose, RN BSN MHA CCM Trauma/Neuro ICU Case Manager [phone]  
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
## 2014-08-09 LAB — CBC
HCT: 30.8 % — ABNORMAL LOW (ref 36.0–46.0)
HEMOGLOBIN: 9.8 g/dL — AB (ref 12.0–15.0)
MCH: 27.5 pg (ref 26.0–34.0)
MCHC: 31.8 g/dL (ref 30.0–36.0)
MCV: 86.5 fL (ref 78.0–100.0)
Platelets: 486 10*3/uL — ABNORMAL HIGH (ref 150–400)
RBC: 3.56 MIL/uL — ABNORMAL LOW (ref 3.87–5.11)
RDW: 14.8 % (ref 11.5–15.5)
WBC: 7.5 10*3/uL (ref 4.0–10.5)

## 2014-08-09 LAB — BASIC METABOLIC PANEL
Anion gap: 4 — ABNORMAL LOW (ref 5–15)
BUN: 11 mg/dL (ref 6–23)
CALCIUM: 9.2 mg/dL (ref 8.4–10.5)
CO2: 29 mmol/L (ref 19–32)
Chloride: 102 mEq/L (ref 96–112)
Creatinine, Ser: 0.64 mg/dL (ref 0.50–1.10)
GFR calc Af Amer: >90 mL/min (ref >90)
GLUCOSE: 113 mg/dL — AB (ref 70–99)
Potassium: 4.2 mmol/L (ref 3.5–5.1)
SODIUM: 135 mmol/L (ref 135–145)

## 2014-08-09 NOTE — Progress Notes (Signed)
PT Cancellation Note  Patient Details Name: Sherry DecemberMarilyn G Sylvia MRN: [id number] DOB: March 25, 1951   Cancelled Treatment:    Reason Eval/Treat Not Completed: Other (comment). Patient is already up in recliner this afternoon and prefers to stay up. At this time patient completing transfer with staff well. Patient is planning for surgery to remove external fixator. Will have PT follow up Wednesday as appropriate   Fredrich BirksRobinette, Julia Elizabeth 08/09/2014, 2:44 PM
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
## 2014-08-10 ENCOUNTER — Inpatient Hospital Stay (HOSPITAL_COMMUNITY): Payer: BC Managed Care – PPO

## 2014-08-10 ENCOUNTER — Encounter (HOSPITAL_COMMUNITY): Payer: Self-pay | Admitting: Certified Registered"

## 2014-08-10 ENCOUNTER — Encounter (HOSPITAL_COMMUNITY): Admission: EM | Disposition: A | Discharge status: Rehabilitation Facility | Payer: Self-pay | Source: Home / Self Care

## 2014-08-10 ENCOUNTER — Inpatient Hospital Stay (HOSPITAL_COMMUNITY): Payer: BC Managed Care – PPO | Admitting: Anesthesiology

## 2014-08-10 HISTORY — PX: ORIF ANKLE FRACTURE: SHX5408

## 2014-08-10 HISTORY — PX: EXTERNAL FIXATION REMOVAL: SHX5040

## 2014-08-10 HISTORY — PX: SYNDESMOSIS REPAIR: SHX5182

## 2014-08-10 HISTORY — PX: ORIF TIBIA PLATEAU: SHX2132

## 2014-08-10 HISTORY — PX: INCISION AND DRAINAGE OF WOUND: SHX1803

## 2014-08-10 HISTORY — PX: ORIF TOE FRACTURE: SHX5032

## 2014-08-10 LAB — POCT I-STAT 4, (NA,K, GLUC, HGB,HCT)
Glucose, Bld: 133 mg/dL — ABNORMAL HIGH (ref 70–99)
HCT: 26 % — ABNORMAL LOW (ref 36.0–46.0)
HEMOGLOBIN: 8.8 g/dL — AB (ref 12.0–15.0)
Potassium: 4 mmol/L (ref 3.5–5.1)
Sodium: 137 mmol/L (ref 135–145)

## 2014-08-10 LAB — SURGICAL PCR SCREEN
MRSA, PCR: NEGATIVE
STAPHYLOCOCCUS AUREUS: NEGATIVE

## 2014-08-10 LAB — ABO/RH: ABO/RH(D): A POS

## 2014-08-10 LAB — PREPARE RBC (CROSSMATCH)

## 2014-08-10 SURGERY — REMOVAL, EXTERNAL FIXATION DEVICE, LOWER EXTREMITY
Anesthesia: General | Site: Leg Lower | Laterality: Right

## 2014-08-10 MED ORDER — LACTATED RINGERS IV SOLN
INTRAVENOUS | Status: DC
  Administered 2014-08-10: 11:00:00 via INTRAVENOUS

## 2014-08-10 MED ORDER — LIDOCAINE HCL (CARDIAC) 20 MG/ML IV SOLN
INTRAVENOUS | Status: DC | PRN
  Administered 2014-08-10: 160 mg via INTRAVENOUS

## 2014-08-10 MED ORDER — LACTATED RINGERS IV SOLN: INTRAVENOUS | Status: DC

## 2014-08-10 MED ORDER — PHENYLEPHRINE HCL 10 MG/ML IJ SOLN
INTRAMUSCULAR | Status: DC | PRN
  Administered 2014-08-10: 40 ug via INTRAVENOUS
  Administered 2014-08-10 (×2): 80 ug via INTRAVENOUS
  Administered 2014-08-10: 40 ug via INTRAVENOUS
  Administered 2014-08-10: 80 ug via INTRAVENOUS

## 2014-08-10 MED ORDER — GLYCOPYRROLATE 0.2 MG/ML IJ SOLN
INTRAMUSCULAR | Status: DC | PRN
  Administered 2014-08-10: 0.6 mg via INTRAVENOUS

## 2014-08-10 MED ORDER — MEPERIDINE HCL 25 MG/ML IJ SOLN: 6.2500 mg | INTRAMUSCULAR | Status: DC | PRN

## 2014-08-10 MED ORDER — 0.9 % SODIUM CHLORIDE (POUR BTL) OPTIME
TOPICAL | Status: DC | PRN
  Administered 2014-08-10: 1000 mL

## 2014-08-10 MED ORDER — NEOSTIGMINE METHYLSULFATE 10 MG/10ML IV SOLN
INTRAVENOUS | Status: DC | PRN
  Administered 2014-08-10: 4 mg via INTRAVENOUS

## 2014-08-10 MED ORDER — CEFAZOLIN SODIUM-DEXTROSE 2-3 GM-% IV SOLR
INTRAVENOUS | Status: AC
  Administered 2014-08-10: 2 g via INTRAVENOUS
  Filled 2014-08-10: qty 50

## 2014-08-10 MED ORDER — PROPOFOL 10 MG/ML IV BOLUS
INTRAVENOUS | Status: DC | PRN
  Administered 2014-08-10: 150 mg via INTRAVENOUS

## 2014-08-10 MED ORDER — ONDANSETRON HCL 4 MG/2ML IJ SOLN
INTRAMUSCULAR | Status: DC | PRN
  Administered 2014-08-10: 4 mg via INTRAVENOUS

## 2014-08-10 MED ORDER — FENTANYL CITRATE 0.05 MG/ML IJ SOLN
INTRAMUSCULAR | Status: DC | PRN
  Administered 2014-08-10: 100 ug via INTRAVENOUS
  Administered 2014-08-10 (×2): 50 ug via INTRAVENOUS
  Administered 2014-08-10: 100 ug via INTRAVENOUS
  Administered 2014-08-10 (×2): 50 ug via INTRAVENOUS
  Administered 2014-08-10: 100 ug via INTRAVENOUS

## 2014-08-10 MED ORDER — MIDAZOLAM HCL 5 MG/5ML IJ SOLN
INTRAMUSCULAR | Status: DC | PRN
  Administered 2014-08-10 (×2): 1 mg via INTRAVENOUS

## 2014-08-10 MED ORDER — ROCURONIUM BROMIDE 100 MG/10ML IV SOLN
INTRAVENOUS | Status: DC | PRN
  Administered 2014-08-10: 40 mg via INTRAVENOUS

## 2014-08-10 MED ORDER — ALBUMIN HUMAN 5 % IV SOLN
INTRAVENOUS | Status: DC | PRN
  Administered 2014-08-10: 14:00:00 via INTRAVENOUS

## 2014-08-10 MED ORDER — METOCLOPRAMIDE HCL 5 MG/ML IJ SOLN: 5.0000 mg | Freq: Three times a day (TID) | INTRAMUSCULAR | Status: DC | PRN

## 2014-08-10 MED ORDER — FENTANYL CITRATE 0.05 MG/ML IJ SOLN
25.0000 ug | INTRAMUSCULAR | Status: DC | PRN
  Administered 2014-08-10 (×3): 50 ug via INTRAVENOUS

## 2014-08-10 MED ORDER — FENTANYL CITRATE 0.05 MG/ML IJ SOLN
INTRAMUSCULAR | Status: AC
Start: 2014-08-10 — End: 2014-08-10
  Filled 2014-08-10: qty 2

## 2014-08-10 MED ORDER — FENTANYL CITRATE 0.05 MG/ML IJ SOLN
INTRAMUSCULAR | Status: AC
  Filled 2014-08-10: qty 2

## 2014-08-10 MED ORDER — LIDOCAINE HCL (CARDIAC) 20 MG/ML IV SOLN
INTRAVENOUS | Status: AC
  Filled 2014-08-10: qty 5

## 2014-08-10 MED ORDER — FENTANYL CITRATE 0.05 MG/ML IJ SOLN
INTRAMUSCULAR | Status: AC
  Filled 2014-08-10: qty 5

## 2014-08-10 MED ORDER — MIDAZOLAM HCL 2 MG/2ML IJ SOLN
INTRAMUSCULAR | Status: AC
  Filled 2014-08-10: qty 2

## 2014-08-10 MED ORDER — SODIUM CHLORIDE 0.9 % IV SOLN: 10.0000 mL/h | Freq: Once | INTRAVENOUS | Status: AC

## 2014-08-10 MED ORDER — SODIUM CHLORIDE 0.9 % IV SOLN
10.0000 mg | INTRAVENOUS | Status: DC | PRN
  Administered 2014-08-10: 10 ug/min via INTRAVENOUS

## 2014-08-10 MED ORDER — LACTATED RINGERS IV SOLN
INTRAVENOUS | Status: DC | PRN
Start: 2014-08-10 — End: 2014-08-10
  Administered 2014-08-10 (×3): via INTRAVENOUS

## 2014-08-10 MED ORDER — METOCLOPRAMIDE HCL 5 MG PO TABS: 5.0000 mg | ORAL_TABLET | Freq: Three times a day (TID) | ORAL | Status: DC | PRN

## 2014-08-10 MED ORDER — SODIUM CHLORIDE 0.9 % IV SOLN
INTRAVENOUS | Status: DC | PRN
  Administered 2014-08-10: 15:00:00 via INTRAVENOUS

## 2014-08-10 MED ORDER — PROMETHAZINE HCL 25 MG/ML IJ SOLN: 6.2500 mg | INTRAMUSCULAR | Status: DC | PRN

## 2014-08-10 MED ORDER — FENTANYL CITRATE 0.05 MG/ML IJ SOLN
INTRAMUSCULAR | Status: AC
  Administered 2014-08-10: 50 ug via INTRAVENOUS
  Filled 2014-08-10: qty 2

## 2014-08-10 MED ORDER — DEXAMETHASONE SODIUM PHOSPHATE 4 MG/ML IJ SOLN
INTRAMUSCULAR | Status: DC | PRN
  Administered 2014-08-10: 8 mg via INTRAVENOUS

## 2014-08-10 MED ORDER — FENTANYL CITRATE 0.05 MG/ML IJ SOLN
100.0000 ug | Freq: Once | INTRAMUSCULAR | Status: AC
  Administered 2014-08-10: 100 ug via INTRAVENOUS

## 2014-08-10 MED ORDER — CEFAZOLIN SODIUM-DEXTROSE 2-3 GM-% IV SOLR
2.0000 g | Freq: Four times a day (QID) | INTRAVENOUS | Status: AC
  Administered 2014-08-10 – 2014-08-11 (×3): 2 g via INTRAVENOUS
  Filled 2014-08-10 (×3): qty 50

## 2014-08-10 SURGICAL SUPPLY — 99 items
BANDAGE ELASTIC 6 VELCRO ST LF (GAUZE/BANDAGES/DRESSINGS) ×5 IMPLANT
BANDAGE ESMARK 6X9 LF (GAUZE/BANDAGES/DRESSINGS) ×6 IMPLANT
BIT DRILL 125X2XTWST CALB (BIT) ×3 IMPLANT
BIT DRILL 2.5X125 (BIT) ×5 IMPLANT
BIT DRILL 3.1X204 (BIT) ×5 IMPLANT
BIT DRILL CANN 2.7 (BIT) ×1
BIT DRILL CANN 2.7MM (BIT) ×1
BIT DRILL SRG 2.7XCANN AO CPLG (BIT) ×3 IMPLANT
BIT DRL 125X2XTWST CALB (BIT) ×3
BIT DRL SRG 2.7XCANN AO CPLNG (BIT) ×3
BLADE SURG 10 STRL SS (BLADE) ×20 IMPLANT
BLADE SURG 15 STRL LF DISP TIS (BLADE) ×9 IMPLANT
BLADE SURG 15 STRL SS (BLADE) ×6
BNDG ELASTIC 6X15 VLCR STRL LF (GAUZE/BANDAGES/DRESSINGS) ×5 IMPLANT
BNDG ESMARK 6X9 LF (GAUZE/BANDAGES/DRESSINGS) ×10
BNDG GAUZE ELAST 4 BULKY (GAUZE/BANDAGES/DRESSINGS) ×5 IMPLANT
CANISTER SUCTION 2500CC (MISCELLANEOUS) ×5 IMPLANT
CLOSURE WOUND 1/2 X4 (GAUZE/BANDAGES/DRESSINGS) ×1
COVER MAYO STAND STRL (DRAPES) ×5 IMPLANT
COVER SURGICAL LIGHT HANDLE (MISCELLANEOUS) ×5 IMPLANT
DRAPE C-ARM 42X72 X-RAY (DRAPES) ×10 IMPLANT
DRAPE C-ARMOR (DRAPES) ×5 IMPLANT
DRAPE IMP U-DRAPE 54X76 (DRAPES) ×10 IMPLANT
DRAPE INCISE IOBAN 66X45 STRL (DRAPES) ×5 IMPLANT
DRAPE ORTHO SPLIT 77X108 STRL (DRAPES)
DRAPE SURG ORHT 6 SPLT 77X108 (DRAPES) IMPLANT
DRAPE U-SHAPE 47X51 STRL (DRAPES) ×5 IMPLANT
DRILL 2.6X122MM WL AO SHAFT (BIT) ×5 IMPLANT
DRILL BIT 2.0 (BIT) ×2
DRSG PAD ABDOMINAL 8X10 ST (GAUZE/BANDAGES/DRESSINGS) ×20 IMPLANT
DURAPREP 26ML APPLICATOR (WOUND CARE) ×5 IMPLANT
ELECT REM PT RETURN 9FT ADLT (ELECTROSURGICAL) ×5
ELECTRODE REM PT RTRN 9FT ADLT (ELECTROSURGICAL) ×3 IMPLANT
GAUZE SPONGE 4X4 12PLY STRL (GAUZE/BANDAGES/DRESSINGS) ×20 IMPLANT
GAUZE XEROFORM 5X9 LF (GAUZE/BANDAGES/DRESSINGS) ×10 IMPLANT
GLOVE BIO SURGEON STRL SZ7.5 (GLOVE) ×25 IMPLANT
GLOVE BIO SURGEON STRL SZ8 (GLOVE) ×5 IMPLANT
GLOVE BIOGEL PI IND STRL 8 (GLOVE) ×3 IMPLANT
GLOVE BIOGEL PI INDICATOR 8 (GLOVE) ×2
GLOVE BIOGEL PI ORTHO PRO SZ8 (GLOVE)
GLOVE PI ORTHO PRO STRL SZ8 (GLOVE) IMPLANT
GOWN STRL REUS W/ TWL LRG LVL3 (GOWN DISPOSABLE) ×6 IMPLANT
GOWN STRL REUS W/ TWL XL LVL3 (GOWN DISPOSABLE) ×3 IMPLANT
GOWN STRL REUS W/TWL 2XL LVL3 (GOWN DISPOSABLE) ×5 IMPLANT
GOWN STRL REUS W/TWL LRG LVL3 (GOWN DISPOSABLE) ×4
GOWN STRL REUS W/TWL XL LVL3 (GOWN DISPOSABLE) ×2
IMMOBILIZER KNEE 22 (SOFTGOODS) ×5 IMPLANT
IMMOBILIZER KNEE 22 UNIV (SOFTGOODS) ×5 IMPLANT
K-WIRE ORTHOPEDIC 1.4X150L (WIRE) ×10
KIT BASIN OR (CUSTOM PROCEDURE TRAY) ×5 IMPLANT
KIT ROOM TURNOVER OR (KITS) ×5 IMPLANT
KWIRE ORTHOPEDIC 1.4X150L (WIRE) ×6 IMPLANT
LIQUID BAND (GAUZE/BANDAGES/DRESSINGS) ×5 IMPLANT
MANIFOLD NEPTUNE II (INSTRUMENTS) ×5 IMPLANT
NEEDLE 22X1 1/2 (OR ONLY) (NEEDLE) ×5 IMPLANT
NS IRRIG 1000ML POUR BTL (IV SOLUTION) ×5 IMPLANT
PACK ORTHO EXTREMITY (CUSTOM PROCEDURE TRAY) ×5 IMPLANT
PAD ARMBOARD 7.5X6 YLW CONV (MISCELLANEOUS) ×10 IMPLANT
PAD CAST 4YDX4 CTTN HI CHSV (CAST SUPPLIES) ×3 IMPLANT
PADDING CAST COTTON 4X4 STRL (CAST SUPPLIES) ×2
PLATE 5H 72MM (Plate) ×5 IMPLANT
PLATE PROX TIBIA 2H RIGHT (Plate) ×5 IMPLANT
SCREW BONE NON-LCKING 3.5X12MM (Screw) ×15 IMPLANT
SCREW CANNULATED TI 4.0X44 (Screw) ×10 IMPLANT
SCREW CORTEX ST MATTA 3.5X28MM (Screw) ×5 IMPLANT
SCREW CORTEX ST MATTA 3.5X34MM (Screw) ×5 IMPLANT
SCREW CORTICAL 2.7X24MM (Screw) ×10 IMPLANT
SCREW LOCK 3.5X14 (Screw) ×10 IMPLANT
SCREW LOCKING 3.5X12 (Screw) ×5 IMPLANT
SCREW LOCKING 4.0X65MM (Screw) ×5 IMPLANT
SCREW LOCKING 4.0X80MM (Screw) ×20 IMPLANT
SCREW LOCKING 75MM (Screw) ×5 IMPLANT
SCREW NONLOCK 48MM (Screw) ×5 IMPLANT
SPLINT PLASTER CAST XFAST 5X30 (CAST SUPPLIES) ×3 IMPLANT
SPLINT PLASTER XFAST SET 5X30 (CAST SUPPLIES) ×2
SPONGE GAUZE 4X4 12PLY STER LF (GAUZE/BANDAGES/DRESSINGS) ×10 IMPLANT
SPONGE LAP 18X18 X RAY DECT (DISPOSABLE) ×15 IMPLANT
STOCKINETTE IMPERVIOUS LG (DRAPES) ×5 IMPLANT
STRIP CLOSURE SKIN 1/2X4 (GAUZE/BANDAGES/DRESSINGS) ×4 IMPLANT
SUCTION FRAZIER TIP 10 FR DISP (SUCTIONS) ×10 IMPLANT
SUT ETHILON 3 0 PS 1 (SUTURE) ×10 IMPLANT
SUT FIBERWIRE 2-0 18 17.9 3/8 (SUTURE) ×10
SUT MNCRL AB 4-0 PS2 18 (SUTURE) ×15 IMPLANT
SUT MON AB 2-0 CT1 27 (SUTURE) ×5 IMPLANT
SUT MON AB 2-0 CT1 36 (SUTURE) ×10 IMPLANT
SUT VIC AB 0 CT1 27 (SUTURE) ×8
SUT VIC AB 0 CT1 27XBRD ANBCTR (SUTURE) ×12 IMPLANT
SUT VIC AB 3-0 SH 27 (SUTURE) ×4
SUT VIC AB 3-0 SH 27XBRD (SUTURE) ×6 IMPLANT
SUTURE FIBERWR 2-0 18 17.9 3/8 (SUTURE) ×6 IMPLANT
SYR BULB IRRIGATION 50ML (SYRINGE) ×5 IMPLANT
SYR CONTROL 10ML LL (SYRINGE) IMPLANT
TOWEL OR 17X24 6PK STRL BLUE (TOWEL DISPOSABLE) ×15 IMPLANT
TOWEL OR 17X26 10 PK STRL BLUE (TOWEL DISPOSABLE) ×10 IMPLANT
TOWEL OR NON WOVEN STRL DISP B (DISPOSABLE) ×5 IMPLANT
TRAY FOLEY CATH 14FR (SET/KITS/TRAYS/PACK) ×5 IMPLANT
TUBE CONNECTING 12'X1/4 (SUCTIONS) ×1
TUBE CONNECTING 12X1/4 (SUCTIONS) ×4 IMPLANT
YANKAUER SUCT BULB TIP NO VENT (SUCTIONS) ×5 IMPLANT

## 2014-08-10 NOTE — OR Nursing (Signed)
Hart CarwinJustin Queen serving as RNFA for this case.

## 2014-08-10 NOTE — Discharge Instructions (Signed)
No weight on your right leg, knee immobilizer full time, keep your dressings and splint intact and dry  On the Left you may use this to transfer to a chair but otherwise no weight on the foot. Boot full time
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
## 2014-08-10 NOTE — Op Note (Signed)
07/29/2014 - 08/10/2014  3:09 PM  PATIENT:  Sherry Gutierrez    PRE-OPERATIVE DIAGNOSIS:  EX FIX  POST-OPERATIVE DIAGNOSIS:  Same  PROCEDURE:  REMOVAL EXTERNAL FIXATION LEG, OPEN REDUCTION INTERNAL FIXATION (ORIF) TIBIAL PLATEAU, OPEN REDUCTION INTERNAL FIXATION (ORIF) METATARSAL (TOE) FRACTURE, OPEN REDUCTION INTERNAL FIXATION (ORIF) ANKLE FRACTURE, ORIF SYNDESMOSIS ANKLE, IRRIGATION AND DEBRIDEMENT WOUND  SURGEON:  Marlaina Coburn, D, MD  ASSISTANT: Janace LittenBrandon Parry, OPA, He was necessary for efficiency and safety of the case.   ANESTHESIA:   gen  PREOPERATIVE INDICATIONS:  Sherry Gutierrez is a  64 y.o. female with a diagnosis of EX FIX who failed conservative measures and elected for surgical management.    The risks benefits and alternatives were discussed with the patient preoperatively including but not limited to the risks of infection, bleeding, nerve injury, cardiopulmonary complications, the need for revision surgery, among others, and the patient was willing to proceed.  OPERATIVE IMPLANTS: stryker ankle solution, ant lat tibial plate.   OPERATIVE FINDINGS: unstable syndesmosis  BLOOD LOSS: 3-400  COMPLICATIONS: none  TOURNIQUET TIME: 45R 45L  OPERATIVE PROCEDURE:  Patient was identified in the preoperative holding area and site was marked by me She was transported to the operating theater and placed on the table in supine position taking care to pad all bony prominences. After a preincinduction time out anesthesia was induced. The bilateral lower extremity was prepped and draped in normal sterile fashion and a pre-incision timeout was performed. She received ancef for preoperative antibiotics.   Initially removed the lateral side of the knee spanning external fixator. I then made a anterolateral incision for approach to the tibial plateau I dissected down to the fascia and incised the anterior compartment fascia and elevated the muscle off the anterior lateral aspect of the  tibia. Carried this proximally and identified the joint capsule I elevated the fascia off of the capsule and then performed a sub-meniscal arthrotomy. I placed 2 tacking stitches securing the torn medial meniscus*the torn lateral meniscus back to the capsule.  I then identified the lateral plateau and there is really minimal step off there was significant arthritis. Took multiple x-rays and confirmed appropriate reduction of the medial side as well and place an anterior lateral locking plate thing care to stay out of the joint and maintained alignment after my reduction. Had excellent bite on the shaft screws.  I then took multiple x-rays was happy with the reduction and placement of all hardware.  I thoroughly irrigated the wounds and closed the skin using a absorbable stitch.  Next I turned my attention to the ankle. I made a 6 mL lateral incision and protected the superficial peroneal nerve and incise down to the fibula and incise the periosteum longitudinally identified identified the fracture there is severe comminution. I used x-ray as well as x-rays of the other ankle to set my length on the fibula and fixed using a bridge plate with a titanium locking plate placed for distal locking screws and had 3 good shaft screws proximally. I then went to the medial side where I reopened her traumatic wound and extended this distally. Identified her medial Mal debrided of soft tissue that was interposed in the fracture.  I then performed a reduction and held in place with tenaculum and placed 2 44 mm cannulated screws.   I then noted a small radio dense object in the corner of the joint I took multiple x-rays and determine there was some of the comminution in the posterior  aspect of the medial Mal side elected to leave it as it is likely extra-articular. I then stressed the syndesmosis and it was unstable.  I placed a single tricortical syndesmotic screw through the plate and was happy with the reduction  of the syndesmosis.  I then thoroughly irrigated both these wounds I closed the lateral side using absorbable stitch and close the medial side using a nylon closing the traumatic wound loosely in case it needed to drain. I then removed all X fix parts from this side. Next I turned my attention the medial looked to the left foot and her first metatarsal fracture. I made a 3 cm incision over top of the dorsomedial aspect of the base of the first metatarsal and used a spreading dissection down identified some superficial dorsal cutaneous nerves.  I retracted these nerves and protected them throughout protected them throughout the case. Identified her fracture and performed a closed reduction as well as a open reduction of this fracture I placed 2 screws across the fracture site holding it in place.  I took multiple x-rays of the foot and was happy with the reduction and placement of hardware. I then thoroughly irrigated this wound closed with an absorbable stitch. Sterile dressing was applied to all both legs and a short leg splint on the right as well as a knee immobilizer a boot was placed on the left side. She was then awoken and taken the PACU in stable condition.  She did receive 1 unit of blood during the case.  POST OPERATIVE PLAN: NWB RLE, WB L for pivot transfer only. Continue chemical dvt px.     This note was generated using a template and dragon dictation system. In light of that, I have reviewed the note and all aspects of it are applicable to this case. Any dictation errors are due to the computerized dictation system.

## 2014-08-10 NOTE — Interval H&P Note (Signed)
History and Physical Interval Note:  08/10/2014 7:48 AM  Sherry Gutierrez  has presented today for surgery, with the diagnosis of EX FIX  The various methods of treatment have been discussed with the patient and family. After consideration of risks, benefits and other options for treatment, the patient has consented to  Procedure(s): REMOVAL EXTERNAL FIXATION LEG (Right) OPEN REDUCTION INTERNAL FIXATION (ORIF) ANKLE FRACTURE (Right) OPEN REDUCTION INTERNAL FIXATION (ORIF) TIBIAL PLATEAU (Right) as a surgical intervention .  [name] history has been reviewed, patient examined, no change in status, stable for surgery.  I have reviewed the patient's chart and labs.  Questions were answered to the patient's satisfaction.     Nazaret Chea, D

## 2014-08-10 NOTE — Progress Notes (Signed)
I await PT and OT reevals postop to assist in dispo planning. Last week pt was hopeful to d/c home after removal of ex fix I will follow up tomorrow. [phone]

## 2014-08-10 NOTE — Transfer of Care (Signed)
Immediate Anesthesia Transfer of Care Note  Patient: Sherry Gutierrez  Procedure(s) Performed: Procedure(s) with comments: REMOVAL EXTERNAL FIXATION LEG (Right) OPEN REDUCTION INTERNAL FIXATION (ORIF) TIBIAL PLATEAU (Right) OPEN REDUCTION INTERNAL FIXATION (ORIF) METATARSAL (TOE) FRACTURE (Left) OPEN REDUCTION INTERNAL FIXATION (ORIF) ANKLE FRACTURE (Right) - Bim[employer] ORIF SYNDESMOSIS ANKLE (Right) IRRIGATION AND DEBRIDEMENT WOUND (Right)  Patient Location: PACU  Anesthesia Type:General  Level of Consciousness: awake, alert  and oriented  Airway & Oxygen Therapy: Patient Spontanous Breathing and Patient connected to nasal cannula oxygen  Post-op Assessment: Report given to PACU RN, Post -op Vital signs reviewed and stable and Patient moving all extremities X 4  Post vital signs: Reviewed and stable  Complications: No apparent anesthesia complications

## 2014-08-11 LAB — CBC
HCT: 28.1 % — ABNORMAL LOW (ref 36.0–46.0)
Hemoglobin: 9.1 g/dL — ABNORMAL LOW (ref 12.0–15.0)
MCH: 27.5 pg (ref 26.0–34.0)
MCHC: 32.4 g/dL (ref 30.0–36.0)
MCV: 84.9 fL (ref 78.0–100.0)
Platelets: 446 10*3/uL — ABNORMAL HIGH (ref 150–400)
RBC: 3.31 MIL/uL — ABNORMAL LOW (ref 3.87–5.11)
RDW: 14.7 % (ref 11.5–15.5)
WBC: 10.9 10*3/uL — ABNORMAL HIGH (ref 4.0–10.5)

## 2014-08-11 NOTE — Progress Notes (Signed)
UR completed.  Awaiting insurance auth for CIR.  Carlyle LipaMichelle Meiko Ives, RN BSN MHA CCM Trauma/Neuro ICU Case Manager [phone]

## 2014-08-11 NOTE — Progress Notes (Signed)
I discussed with pt and with Dr Riley KillSwartz. Both in agreement to inpt rehab admission [facility] [employer] [facility] which I will initiate today. Hopeful to admit over next one to two days pending their approval. I have discussed with SW [phone]
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
## 2014-08-11 NOTE — Progress Notes (Signed)
Pain controlled  LLE: NVI, dressing CDI RLE: wiggles toes, WWP, SILT distally, Dressings C/D/I   Plan: NWB RLE NWB LLE (accept for transfers)  F/u with me in 1-2wks  Margarita RanaMURPHY, Kile Kabler, D [phone]

## 2014-08-11 NOTE — Progress Notes (Signed)
I await PT and OT reeeval to assist with planning dispo. [phone]
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
## 2014-08-12 ENCOUNTER — Encounter (HOSPITAL_COMMUNITY): Payer: Self-pay | Admitting: Orthopedic Surgery

## 2014-08-12 ENCOUNTER — Inpatient Hospital Stay (HOSPITAL_COMMUNITY)
Admission: RE | Admit: 2014-08-12 | Discharge: 2014-08-25 | DRG: 560 | Disposition: A | Discharge status: Home Health | Payer: BC Managed Care – PPO | Source: Intra-hospital | Attending: Physical Medicine & Rehabilitation | Admitting: Physical Medicine & Rehabilitation

## 2014-08-12 DIAGNOSIS — I10 Essential (primary) hypertension: Secondary | ICD-10-CM

## 2014-08-12 DIAGNOSIS — S82141A Displaced bicondylar fracture of right tibia, initial encounter for closed fracture: Secondary | ICD-10-CM | POA: Diagnosis present

## 2014-08-12 DIAGNOSIS — S2220XA Unspecified fracture of sternum, initial encounter for closed fracture: Secondary | ICD-10-CM

## 2014-08-12 DIAGNOSIS — D62 Acute posthemorrhagic anemia: Secondary | ICD-10-CM

## 2014-08-12 DIAGNOSIS — S82141D Displaced bicondylar fracture of right tibia, subsequent encounter for closed fracture with routine healing: Secondary | ICD-10-CM | POA: Diagnosis not present

## 2014-08-12 DIAGNOSIS — S8291XS Unspecified fracture of right lower leg, sequela: Secondary | ICD-10-CM | POA: Diagnosis not present

## 2014-08-12 DIAGNOSIS — S92312D Displaced fracture of first metatarsal bone, left foot, subsequent encounter for fracture with routine healing: Secondary | ICD-10-CM | POA: Diagnosis not present

## 2014-08-12 DIAGNOSIS — S82201B Unspecified fracture of shaft of right tibia, initial encounter for open fracture type I or II: Secondary | ICD-10-CM | POA: Diagnosis present

## 2014-08-12 DIAGNOSIS — E669 Obesity, unspecified: Secondary | ICD-10-CM

## 2014-08-12 DIAGNOSIS — K5900 Constipation, unspecified: Secondary | ICD-10-CM

## 2014-08-12 DIAGNOSIS — F4310 Post-traumatic stress disorder, unspecified: Secondary | ICD-10-CM | POA: Diagnosis not present

## 2014-08-12 DIAGNOSIS — F419 Anxiety disorder, unspecified: Secondary | ICD-10-CM

## 2014-08-12 DIAGNOSIS — S92313S Displaced fracture of first metatarsal bone, unspecified foot, sequela: Secondary | ICD-10-CM | POA: Diagnosis not present

## 2014-08-12 DIAGNOSIS — S82831D Other fracture of upper and lower end of right fibula, subsequent encounter for closed fracture with routine healing: Secondary | ICD-10-CM | POA: Diagnosis not present

## 2014-08-12 DIAGNOSIS — D72829 Elevated white blood cell count, unspecified: Secondary | ICD-10-CM

## 2014-08-12 DIAGNOSIS — S301XXA Contusion of abdominal wall, initial encounter: Secondary | ICD-10-CM

## 2014-08-12 DIAGNOSIS — S82401B Unspecified fracture of shaft of right fibula, initial encounter for open fracture type I or II: Secondary | ICD-10-CM

## 2014-08-12 DIAGNOSIS — S8290XS Unspecified fracture of unspecified lower leg, sequela: Secondary | ICD-10-CM | POA: Diagnosis not present

## 2014-08-12 DIAGNOSIS — S8292XS Unspecified fracture of left lower leg, sequela: Secondary | ICD-10-CM | POA: Diagnosis not present

## 2014-08-12 DIAGNOSIS — D500 Iron deficiency anemia secondary to blood loss (chronic): Secondary | ICD-10-CM | POA: Diagnosis not present

## 2014-08-12 DIAGNOSIS — S82141S Displaced bicondylar fracture of right tibia, sequela: Secondary | ICD-10-CM | POA: Diagnosis not present

## 2014-08-12 DIAGNOSIS — S8251XD Displaced fracture of medial malleolus of right tibia, subsequent encounter for closed fracture with routine healing: Secondary | ICD-10-CM | POA: Diagnosis present

## 2014-08-12 DIAGNOSIS — S2220XS Unspecified fracture of sternum, sequela: Secondary | ICD-10-CM | POA: Diagnosis not present

## 2014-08-12 DIAGNOSIS — T148XXA Other injury of unspecified body region, initial encounter: Secondary | ICD-10-CM

## 2014-08-12 LAB — CREATININE, SERUM
Creatinine, Ser: 1 mg/dL (ref 0.50–1.10)
GFR calc Af Amer: 69 mL/min — ABNORMAL LOW (ref >90)
GFR calc non Af Amer: 60 mL/min — ABNORMAL LOW (ref >90)

## 2014-08-12 MED ORDER — ACETAMINOPHEN 325 MG PO TABS
325.0000 mg | ORAL_TABLET | ORAL | Status: DC | PRN
  Filled 2014-08-12: qty 2

## 2014-08-12 MED ORDER — METHOCARBAMOL 500 MG PO TABS
1000.0000 mg | ORAL_TABLET | Freq: Three times a day (TID) | ORAL | Status: DC
  Administered 2014-08-12 – 2014-08-20 (×25): 1000 mg via ORAL
  Filled 2014-08-12 (×39): qty 2

## 2014-08-12 MED ORDER — POLYETHYLENE GLYCOL 3350 17 G PO PACK
17.0000 g | PACK | Freq: Two times a day (BID) | ORAL | Status: DC
  Administered 2014-08-12 – 2014-08-25 (×12): 17 g via ORAL
  Filled 2014-08-12 (×28): qty 1

## 2014-08-12 MED ORDER — ENOXAPARIN SODIUM 30 MG/0.3ML ~~LOC~~ SOLN
30.0000 mg | Freq: Two times a day (BID) | SUBCUTANEOUS | Status: DC
  Administered 2014-08-12 – 2014-08-25 (×26): 30 mg via SUBCUTANEOUS
  Filled 2014-08-12 (×30): qty 0.3

## 2014-08-12 MED ORDER — BISACODYL 10 MG RE SUPP: 10.0000 mg | Freq: Every day | RECTAL | Status: DC | PRN

## 2014-08-12 MED ORDER — TRAMADOL HCL 50 MG PO TABS
50.0000 mg | ORAL_TABLET | Freq: Four times a day (QID) | ORAL | Status: DC | PRN
  Administered 2014-08-16 – 2014-08-25 (×25): 50 mg via ORAL
  Filled 2014-08-12 (×26): qty 1

## 2014-08-12 MED ORDER — ALUM & MAG HYDROXIDE-SIMETH 200-200-20 MG/5ML PO SUSP: 30.0000 mL | ORAL | Status: DC | PRN

## 2014-08-12 MED ORDER — HYDROCHLOROTHIAZIDE 12.5 MG PO CAPS
12.5000 mg | ORAL_CAPSULE | Freq: Every day | ORAL | Status: DC
  Administered 2014-08-13 – 2014-08-25 (×13): 12.5 mg via ORAL
  Filled 2014-08-12 (×14): qty 1

## 2014-08-12 MED ORDER — GUAIFENESIN-DM 100-10 MG/5ML PO SYRP: 5.0000 mL | ORAL_SOLUTION | Freq: Four times a day (QID) | ORAL | Status: DC | PRN

## 2014-08-12 MED ORDER — PROCHLORPERAZINE EDISYLATE 5 MG/ML IJ SOLN
5.0000 mg | Freq: Four times a day (QID) | INTRAMUSCULAR | Status: DC | PRN
Start: 2014-08-12 — End: 2014-08-25

## 2014-08-12 MED ORDER — LISINOPRIL 10 MG PO TABS
10.0000 mg | ORAL_TABLET | Freq: Every day | ORAL | Status: DC
  Administered 2014-08-13 – 2014-08-25 (×13): 10 mg via ORAL
  Filled 2014-08-12 (×14): qty 1

## 2014-08-12 MED ORDER — ASPIRIN EC 81 MG PO TBEC
81.0000 mg | DELAYED_RELEASE_TABLET | Freq: Every day | ORAL | Status: DC
  Administered 2014-08-13 – 2014-08-25 (×13): 81 mg via ORAL
  Filled 2014-08-12 (×14): qty 1

## 2014-08-12 MED ORDER — DIPHENHYDRAMINE HCL 12.5 MG/5ML PO ELIX
12.5000 mg | ORAL_SOLUTION | Freq: Four times a day (QID) | ORAL | Status: DC | PRN
  Administered 2014-08-12: 25 mg via ORAL
  Filled 2014-08-12: qty 10

## 2014-08-12 MED ORDER — POLYSACCHARIDE IRON COMPLEX 150 MG PO CAPS
150.0000 mg | ORAL_CAPSULE | Freq: Two times a day (BID) | ORAL | Status: DC
  Administered 2014-08-13 – 2014-08-19 (×14): 150 mg via ORAL
  Filled 2014-08-12 (×18): qty 1

## 2014-08-12 MED ORDER — PROCHLORPERAZINE 25 MG RE SUPP: 12.5000 mg | Freq: Four times a day (QID) | RECTAL | Status: DC | PRN

## 2014-08-12 MED ORDER — PROCHLORPERAZINE MALEATE 5 MG PO TABS
5.0000 mg | ORAL_TABLET | Freq: Four times a day (QID) | ORAL | Status: DC | PRN
  Filled 2014-08-12: qty 2

## 2014-08-12 MED ORDER — OXYCODONE HCL 5 MG PO TABS
15.0000 mg | ORAL_TABLET | ORAL | Status: DC | PRN
Start: 2014-08-12 — End: 2014-08-18
  Administered 2014-08-13 – 2014-08-15 (×10): 15 mg via ORAL
  Filled 2014-08-12 (×10): qty 3

## 2014-08-12 MED ORDER — FLEET ENEMA 7-19 GM/118ML RE ENEM: 1.0000 | ENEMA | Freq: Once | RECTAL | Status: AC | PRN

## 2014-08-12 MED ORDER — TRAZODONE HCL 50 MG PO TABS
25.0000 mg | ORAL_TABLET | Freq: Every evening | ORAL | Status: DC | PRN
  Administered 2014-08-17: 50 mg via ORAL
  Filled 2014-08-12: qty 1
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
## 2014-08-12 NOTE — Progress Notes (Signed)
Patient arrived to 4 [address]14 from [address] via bed with nurse and nurse tech. Patient given brief overview of rehab,safety plan, call bell, and therapy schedule. Information packet given to patient and all questions answered. [place]

## 2014-08-12 NOTE — Clinical Social Work Note (Signed)
Clinical Social Worker continuing to follow patient and family for support and discharge planning needs.  [facility]  Patient agreeable and plans to admit today.  CSW spoke with inpatient rehab admissions coordinator and trauma team to confirm plans.  Clinical Social Worker will sign off for now as social work intervention is no longer needed. Please consult us again if new need arises.  Macario GoldsJesse Vincenzo Stave, [place] [phone]
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
## 2014-08-13 ENCOUNTER — Inpatient Hospital Stay (HOSPITAL_COMMUNITY): Payer: BC Managed Care – PPO

## 2014-08-13 ENCOUNTER — Inpatient Hospital Stay (HOSPITAL_COMMUNITY): Payer: BC Managed Care – PPO | Admitting: Occupational Therapy

## 2014-08-13 ENCOUNTER — Inpatient Hospital Stay (HOSPITAL_COMMUNITY): Payer: BC Managed Care – PPO | Admitting: *Deleted

## 2014-08-13 DIAGNOSIS — S8292XS Unspecified fracture of left lower leg, sequela: Secondary | ICD-10-CM

## 2014-08-13 DIAGNOSIS — D62 Acute posthemorrhagic anemia: Secondary | ICD-10-CM

## 2014-08-13 DIAGNOSIS — S92313S Displaced fracture of first metatarsal bone, unspecified foot, sequela: Secondary | ICD-10-CM

## 2014-08-13 LAB — COMPREHENSIVE METABOLIC PANEL
ALT: 8 U/L (ref 0–35)
AST: 12 U/L (ref 0–37)
Albumin: 2.7 g/dL — ABNORMAL LOW (ref 3.5–5.2)
Alkaline Phosphatase: 85 U/L (ref 39–117)
Anion gap: 4 — ABNORMAL LOW (ref 5–15)
BILIRUBIN TOTAL: 0.5 mg/dL (ref 0.3–1.2)
BUN: 10 mg/dL (ref 6–23)
CALCIUM: 8.6 mg/dL (ref 8.4–10.5)
CHLORIDE: 99 meq/L (ref 96–112)
CO2: 33 mmol/L — AB (ref 19–32)
CREATININE: 0.65 mg/dL (ref 0.50–1.10)
GFR calc Af Amer: >90 mL/min (ref >90)
GFR calc non Af Amer: >90 mL/min (ref >90)
Glucose, Bld: 111 mg/dL — ABNORMAL HIGH (ref 70–99)
Potassium: 3.6 mmol/L (ref 3.5–5.1)
Sodium: 136 mmol/L (ref 135–145)
Total Protein: 5.8 g/dL — ABNORMAL LOW (ref 6.0–8.3)

## 2014-08-13 LAB — CBC WITH DIFFERENTIAL/PLATELET
BASOS PCT: 1 % (ref 0–1)
Basophils Absolute: 0.1 10*3/uL (ref 0.0–0.1)
Eosinophils Absolute: 0.5 10*3/uL (ref 0.0–0.7)
Eosinophils Relative: 6 % — ABNORMAL HIGH (ref 0–5)
HCT: 24.7 % — ABNORMAL LOW (ref 36.0–46.0)
Hemoglobin: 7.8 g/dL — ABNORMAL LOW (ref 12.0–15.0)
LYMPHS PCT: 24 % (ref 12–46)
Lymphs Abs: 1.9 10*3/uL (ref 0.7–4.0)
MCH: 27.5 pg (ref 26.0–34.0)
MCHC: 31.6 g/dL (ref 30.0–36.0)
MCV: 87 fL (ref 78.0–100.0)
Monocytes Absolute: 0.7 10*3/uL (ref 0.1–1.0)
Monocytes Relative: 9 % (ref 3–12)
Neutro Abs: 4.7 10*3/uL (ref 1.7–7.7)
Neutrophils Relative %: 60 % (ref 43–77)
Platelets: 435 10*3/uL — ABNORMAL HIGH (ref 150–400)
RBC: 2.84 MIL/uL — AB (ref 3.87–5.11)
RDW: 14.7 % (ref 11.5–15.5)
WBC: 7.8 10*3/uL (ref 4.0–10.5)
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
## 2014-08-13 NOTE — Progress Notes (Signed)
[place] PHYSICAL MEDICINE & REHABILITATION     PROGRESS NOTE    Subjective/Complaints: Left leg tight in wrap. Pain generally controlled. Slept well. A little constipated.  A  review of systems has been performed and if not noted above is otherwise negative.   Objective: Vital Signs: Blood pressure 115/59, pulse 96, temperature 99.2 F (37.3 C), temperature source Oral, resp. rate 18, SpO2 100 %. No results found.  Recent Labs  08/11/14 0409 08/13/14 0616  WBC 10.9* 7.8  HGB 9.1* 7.8*  HCT 28.1* 24.7*  PLT 446* 435*    Recent Labs  08/10/14 1355 08/12/14 0500 08/13/14 0616  NA 137  --  136  K 4.0  --  3.6  CL  --   --  99  GLUCOSE 133*  --  111*  BUN  --   --  10  CREATININE  --  1.00 0.65  CALCIUM  --   --  8.6   CBG (last 3)  No results for input(s): GLUCAP in the last 72 hours.  Wt Readings from Last 3 Encounters:  07/29/14 99.791 kg (220 lb)  01/18/14 107.049 kg (236 lb)    Physical Exam:  Constitutional: She is oriented to person, place, and time. She appears well-developed and well-nourished.  HENT:  Head: Normocephalic and atraumatic.  Eyes: Conjunctivae are normal. Pupils are equal, round, and reactive to light.  Neck: Normal range of motion. Neck supple.  Cardiovascular: Normal rate and regular rhythm.  Respiratory: Effort normal and breath sounds normal. No respiratory distress. She has no wheezes.  GI: Soft. Bowel sounds are normal. She exhibits no distension. There is no tenderness.  Musculoskeletal:  LLE with ace wrap and CAM boot in place. RLE with compressive dressing from mid thigh to toes. Bivalve splint noted at right ankle and KI on RLE.  Neurological: She is alert and oriented to person, place, and time.  Speech is clear. She follows commands without difficulty. Has good insight and awareness of deficits  Skin: Skin is warm and dry.  Psychiatric: She has a normal mood and affect. Her behavior is normal. Judgment and  thought content normal.  Upper extremity strength 5/5 bilateral deltoid, biceps, triceps, grip Right lower extremity trace hip flexion and knee extension not examined secondary to knee orthosis. Able to wiggle toes Left lower extremity 3/5 hip flexion 4/5 knee extension and ankle not tested secondary to Cam Walker boot, able to wiggle toes Sensation intact to light touch bilateral toes as well as in the hands.  Assessment/Plan: 1. Functional deficits secondary to right tibial plateau fracture, right fibular head/neck fx, right displaced medial malleolus fx, left 1st MT fx which require 3+ hours per day of interdisciplinary therapy in a comprehensive inpatient rehab setting. Physiatrist is providing close team supervision and 24 hour management of active medical problems listed below. Physiatrist and rehab team continue to assess barriers to discharge/monitor patient progress toward functional and medical goals. FIM:                   Comprehension Comprehension Mode: Auditory Comprehension: 7-Follows complex conversation/direction: With no assist  Expression Expression Mode: Verbal Expression: 7-Expresses complex ideas: With no assist  Social Interaction Social Interaction: 7-Interacts appropriately with others - No medications needed.  Problem Solving Problem Solving: 7-Solves complex problems: Recognizes & self-corrects  Memory Memory: 7-Complete Independence: No helper   Medical Problem List and Plan: 1. Functional deficits secondary to S/p Right tibial plateau fracture, right fibular head/neck fracture, right  comminuted displaced medial malleolus fracture,   -NWB RLE and WBAT LLE 2. DVT Prophylaxis/Anticoagulation: Pharmaceutical: Lovenox 3. Pain Management: Will continue oxycodone prn.  4. Mood: LCSW to follow for evaluation and support.  5. Neuropsych: This patient is capable of making decisions on her own behalf. 6. Skin/Wound Care: Monitor wounds daily.  Routine pressure relief measures.  7. Fluids/Electrolytes/Nutrition: Monitor I/O. Encourage PO 8. ABLA: Hgb 7.8 . Follow serialy, Fe++ supp  9. Constipation: Increase miralax to bid. -suppository or enema today if needed 10. Reactive Leucocytosis: decreased.  11. HTN: Monitor BP every 8 hours. Continue lisinopril and HCTZ.   LOS (Days) 1 A FACE TO FACE EVALUATION WAS PERFORMED  Sherry Gutierrez 08/13/2014 9:03 AM

## 2014-08-14 ENCOUNTER — Inpatient Hospital Stay (HOSPITAL_COMMUNITY): Payer: BC Managed Care – PPO | Admitting: *Deleted

## 2014-08-14 ENCOUNTER — Inpatient Hospital Stay (HOSPITAL_COMMUNITY): Payer: BC Managed Care – PPO | Admitting: Physical Therapy

## 2014-08-14 DIAGNOSIS — D500 Iron deficiency anemia secondary to blood loss (chronic): Secondary | ICD-10-CM

## 2014-08-14 DIAGNOSIS — S2220XS Unspecified fracture of sternum, sequela: Secondary | ICD-10-CM

## 2014-08-14 DIAGNOSIS — S82141S Displaced bicondylar fracture of right tibia, sequela: Secondary | ICD-10-CM

## 2014-08-14 LAB — TYPE AND SCREEN
ABO/RH(D): A POS
ANTIBODY SCREEN: NEGATIVE
UNIT DIVISION: 0
Unit division: 0
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
## 2014-08-15 ENCOUNTER — Inpatient Hospital Stay (HOSPITAL_COMMUNITY): Payer: BC Managed Care – PPO | Admitting: *Deleted

## 2014-08-15 DIAGNOSIS — F4310 Post-traumatic stress disorder, unspecified: Secondary | ICD-10-CM

## 2014-08-15 MED ORDER — ALPRAZOLAM 0.5 MG PO TABS: 0.5000 mg | ORAL_TABLET | Freq: Three times a day (TID) | ORAL | Status: DC | PRN
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
## 2014-08-16 ENCOUNTER — Inpatient Hospital Stay (HOSPITAL_COMMUNITY): Payer: BC Managed Care – PPO

## 2014-08-16 LAB — HEMOGLOBIN AND HEMATOCRIT, BLOOD
HCT: 25.1 % — ABNORMAL LOW (ref 36.0–46.0)
Hemoglobin: 8 g/dL — ABNORMAL LOW (ref 12.0–15.0)
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
## 2014-08-16 NOTE — Progress Notes (Signed)
Patient information reviewed and entered into [facility] system by Drevin Ortner, RN, CRRN, PPS Coordinator.  Information including medical coding and functional independence measure will be reviewed and updated through discharge.    

## 2014-08-17 ENCOUNTER — Inpatient Hospital Stay (HOSPITAL_COMMUNITY): Payer: BC Managed Care – PPO

## 2014-08-17 ENCOUNTER — Inpatient Hospital Stay (HOSPITAL_COMMUNITY): Payer: BC Managed Care – PPO | Admitting: Physical Therapy

## 2014-08-17 DIAGNOSIS — S8291XS Unspecified fracture of right lower leg, sequela: Secondary | ICD-10-CM
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
## 2014-08-17 NOTE — Progress Notes (Signed)
Orthopedic Tech Progress Note Patient Details:  Sherry Gutierrez 07/02/1951 [id number] SLS adjusted to patient's liking as ordered. Ortho Devices Type of Ortho Device: Short leg splint Ortho Device/Splint Location: RLE Ortho Device/Splint Interventions: Adjustment   Asia R Thompson 08/17/2014, 10:08 AM
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
## 2014-08-17 NOTE — Care Management Note (Signed)
Inpatient Rehabilitation Center Individual Statement of Services  Patient Name:  Sherry Gutierrez  Date:  08/17/2014  Welcome to the [facility]patient Rehabilitation Center.  Our goal is to provide you with an individualized program based on your diagnosis and situation, designed to meet your specific needs.  With this comprehensive rehabilitation program, you will be expected to participate in at least 3 hours of rehabilitation therapies Monday-Friday, with modified therapy programming on the weekends.  Your rehabilitation program will include the following services:  Physical Therapy (PT), Occupational Therapy (OT), 24 hour per day rehabilitation nursing, Therapeutic Recreaction (TR), Neuropsychology, Case Management (Social Worker), Rehabilitation Medicine, Nutrition Services and Pharmacy Services  Weekly team conferences will be held on Tuesdays to discuss your progress.  Your Social Worker will talk with you frequently to get your input and to update you on team discussions.  Team conferences with you and your family in attendance may also be held.  Expected length of stay: 10-14 days  Overall anticipated outcome: modified independent @ w/c  Depending on your progress and recovery, your program may change. Your Social Worker will coordinate services and will keep you informed of any changes. Your Social Worker's name and contact numbers are listed  below.  The following services may also be recommended but are not provided by the Inpatient Rehabilitation Center:   Driving Evaluations  Home Health Rehabiltiation Services  Outpatient Rehabilitation Services  Vocational Rehabilitation   Arrangements will be made to provide these services after discharge if needed.  Arrangements include referral to agencies that provide these services.  Your insurance has been verified to be:  [employer] Your primary doctor [facility]  Pertinent information will be shared with  your doctor and your insurance company.  Social Worker:  Tyndall AFBLucy Rubert Frediani, [place] [phone] or ([phone]) [phone]   Information discussed with and copy given to patient by: Amada JupiterHOYLE, Vinicius Brockman, 08/17/2014, 6:55 AM
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
## 2014-08-18 ENCOUNTER — Inpatient Hospital Stay (HOSPITAL_COMMUNITY): Payer: BC Managed Care – PPO

## 2014-08-18 ENCOUNTER — Inpatient Hospital Stay (HOSPITAL_COMMUNITY): Payer: BC Managed Care – PPO | Admitting: Occupational Therapy

## 2014-08-18 ENCOUNTER — Inpatient Hospital Stay (HOSPITAL_COMMUNITY): Payer: BC Managed Care – PPO | Admitting: Physical Therapy

## 2014-08-18 DIAGNOSIS — S8290XS Unspecified fracture of unspecified lower leg, sequela: Secondary | ICD-10-CM

## 2014-08-18 MED ORDER — HYDROCODONE-ACETAMINOPHEN 5-325 MG PO TABS: 1.0000 | ORAL_TABLET | Freq: Four times a day (QID) | ORAL | Status: DC | PRN
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
## 2014-08-19 ENCOUNTER — Inpatient Hospital Stay (HOSPITAL_COMMUNITY): Payer: BC Managed Care – PPO

## 2014-08-19 ENCOUNTER — Inpatient Hospital Stay (HOSPITAL_COMMUNITY): Payer: BC Managed Care – PPO | Admitting: Physical Therapy

## 2014-08-19 LAB — CREATININE, SERUM
Creatinine, Ser: 0.74 mg/dL (ref 0.50–1.10)
GFR calc Af Amer: >90 mL/min (ref >90)
GFR, EST NON AFRICAN AMERICAN: 89 mL/min — AB (ref >90)
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
## 2014-08-19 NOTE — Progress Notes (Signed)
Social Work Patient ID: Sherry Gutierrez, female   DOB: Jun 23, 1951, 64 y.o.   MRN: [id number]   Met with pt this morning to review team conference info and discuss d/c planning concerns.  Pt aware and agreeable with the targeted d/c date of 2/3 with mod i w/c goals.  Initially upon admit, pt reported that her son would be able to provide any assistance needed as he is not currently employed.  Unfortunately, she notes that he has had some "family issues arise" and will be out of town for approx 3 weeks.   We discussed her mod i goals and she does feel that her daughter, granddaughter and friends can provide close, intermittent support on a daily basis.  We also discussed needed DME and I provided her with information on temporary ramp agency.    Per pt's agreement, I contacted her daughter, Sherry Gutierrez and reviewed all of the above information.  Daughter confirms that she and other family members, along with pt's friends, "have her covered" in terms of support at home.  Provided daughter with ramp agency info and she plans to contact them today.  Will continue to follow for d/c planning needs and support.   Destine Zirkle, LCSW

## 2014-08-20 ENCOUNTER — Inpatient Hospital Stay (HOSPITAL_COMMUNITY): Payer: BC Managed Care – PPO | Admitting: Physical Therapy

## 2014-08-20 ENCOUNTER — Inpatient Hospital Stay (HOSPITAL_COMMUNITY): Payer: BC Managed Care – PPO
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
## 2014-08-20 NOTE — Plan of Care (Signed)
Problem: RH Furniture Transfers Goal: LTG Patient will perform furniture transfers w/assist (OT/PT LTG: Patient will perform furniture transfers with assistance (OT/PT).  Outcome: Not Applicable Date Met:  80/16/55 Goal d/c due to pt report that all furniture will be moved into storage except for bed and w/c except for chair with solid armrests and pt now refuses to do stand-pivot transfer, reporting MD told her not to do this, though this change has not been documented in chart.
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
## 2014-08-21 ENCOUNTER — Inpatient Hospital Stay (HOSPITAL_COMMUNITY): Payer: BC Managed Care – PPO | Admitting: *Deleted
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
## 2014-08-22 ENCOUNTER — Inpatient Hospital Stay (HOSPITAL_COMMUNITY): Payer: BC Managed Care – PPO

## 2014-08-22 LAB — CBC
HEMATOCRIT: 28 % — AB (ref 36.0–46.0)
HEMOGLOBIN: 8.9 g/dL — AB (ref 12.0–15.0)
MCH: 28 pg (ref 26.0–34.0)
MCHC: 31.8 g/dL (ref 30.0–36.0)
MCV: 88.1 fL (ref 78.0–100.0)
PLATELETS: 498 10*3/uL — AB (ref 150–400)
RBC: 3.18 MIL/uL — AB (ref 3.87–5.11)
RDW: 15.2 % (ref 11.5–15.5)
WBC: 5.8 10*3/uL (ref 4.0–10.5)
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
## 2014-08-23 ENCOUNTER — Inpatient Hospital Stay (HOSPITAL_COMMUNITY): Payer: BC Managed Care – PPO

## 2014-08-23 ENCOUNTER — Inpatient Hospital Stay (HOSPITAL_COMMUNITY): Payer: BC Managed Care – PPO | Admitting: Rehabilitation

## 2014-08-23 LAB — CBC
HCT: 29.4 % — ABNORMAL LOW (ref 36.0–46.0)
Hemoglobin: 9.2 g/dL — ABNORMAL LOW (ref 12.0–15.0)
MCH: 27.6 pg (ref 26.0–34.0)
MCHC: 31.3 g/dL (ref 30.0–36.0)
MCV: 88.3 fL (ref 78.0–100.0)
PLATELETS: 456 10*3/uL — AB (ref 150–400)
RBC: 3.33 MIL/uL — AB (ref 3.87–5.11)
RDW: 15.1 % (ref 11.5–15.5)
WBC: 4.7 10*3/uL (ref 4.0–10.5)

## 2014-08-23 MED ORDER — METHOCARBAMOL 500 MG PO TABS
500.0000 mg | ORAL_TABLET | Freq: Four times a day (QID) | ORAL | Status: DC | PRN
Start: 2014-08-23 — End: 2014-08-25
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
## 2014-08-24 ENCOUNTER — Encounter (HOSPITAL_COMMUNITY): Payer: BC Managed Care – PPO

## 2014-08-24 ENCOUNTER — Inpatient Hospital Stay (HOSPITAL_COMMUNITY): Payer: BC Managed Care – PPO

## 2014-08-24 NOTE — Progress Notes (Signed)
Orthopedic Tech Progress Note Patient Details:  Sherry Gutierrez 21-Jun-1951 [id number]  Patient ID: Sherry Gutierrez, female   DOB: 21-Jun-1951, 64 y.o.   MRN: [id number] Called in advanced brace order; spoke with Francella SolianNadia  Kaylinn Dedic 08/24/2014, 9:11 AM
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
## 2014-08-24 NOTE — Progress Notes (Signed)
Occupational Therapy Session and Discharge Summary  Patient Details  Name: Sherry Gutierrez MRN: [id number] Date of Birth: [birth year]  Today's Date: 08/24/2014 OT Individual Time: 5102-5852 OT Individual Time Calculation (min): 61 min   Skilled Intervention: ADL-retraining with focus on final discharge planning and disposition, re-ed on use of DME/AE, and adaptive bathing, dressing, and toileting skills.   Pt received seated in w/c awaiting therapist and prepared to direct care assist as per planned discharge status.   Pt able to request supplies and setup without cues or prompts and sequentially progressed through bathing/dressing at sink, standing supporting herself while washing peri-area and buttocks while support.   Pt now using 24" SB versus 30" and typically lifts buttocks rather than slides while using board as bridge effectively.   Pt reports no concerns with performance of self-care and she anticipates assist and setup from both her son and granddaughter for setup of bathing/dressing supplies.   Pt reiterated that she will not attempt to access kitchen area or cook meal and her granddaughter will assist with laundry and light housekeeping tasks in exchange for residence privileges.                                                                                                                                                                                                                                                                                                     Discharge Summary  Patient has met 5 of 5 long term goals due to improved activity tolerance, improved balance, ability to compensate for deficits and improved awareness.  Patient to discharge at overall Mod I at w/c level with supervision and setup for bathing and dressing supplies recommendedl.  Patient's care partner is independent to provide the necessary physical assistance at discharge.    Reasons goals not met:  n/a  Recommendation:  Patient may benefit from skilled OT services in home health setting to continue to advance functional skills in the area of iADL after clearance for full weight-bearing BLE.  Equipment: Drop Arm BSC  Reasons for discharge: discharge from hospital  Patient/family agrees  with progress made and goals achieved: Yes  OT Discharge Precautions/Restrictions  Restrictions Weight Bearing Restrictions: Yes RLE Weight Bearing: Non weight bearing LLE Weight Bearing: Weight bearing as tolerated (with cam boot on for  transfers only)  Vital Signs Therapy Vitals Temp: 97.8 F (36.6 C) Temp Source: Oral Pulse Rate: 75 Resp: 16 BP: 95/68 mmHg Patient Position (if appropriate): Lying Oxygen Therapy SpO2: 100 % O2 Device: Not Delivered   Pain Pain Assessment Pain Assessment: 0-10 Pain Score: 2   ADL ADL ADL Comments: Refer to FIM  Vision/Perception  Vision- History Baseline Vision/History: Wears glasses Wears Glasses: Reading only Patient Visual Report: No change from baseline Vision- Assessment Eye Alignment: Within Functional Limits Perception Comments: WFL   Cognition Orientation Level: Oriented X4  Sensation Sensation Light Touch: Appears Intact Stereognosis: Appears Intact Hot/Cold: Appears Intact Proprioception: Appears Intact Coordination Gross Motor Movements are Fluid and Coordinated: Yes (@ BUE) Fine Motor Movements are Fluid and Coordinated: Yes (@ BUE)   Motor  Motor Motor: Within Functional Limits   Mobility  Transfers Transfers: Sit to Stand;Stand to Sit Sit to Stand: 6: Modified independent (Device/Increase time) Stand to Sit: 6: Modified independent (Device/Increase time)   Trunk/Postural Assessment  Cervical Assessment Cervical Assessment: Within Functional Limits Thoracic Assessment Thoracic Assessment: Within Functional Limits Lumbar Assessment Lumbar Assessment: Within Functional Limits Postural Control Postural  Control: Within Functional Limits    Extremity/Trunk Assessment RUE Assessment RUE Assessment: Within Functional Limits LUE Assessment LUE Assessment: Within Functional Limits  See FIM for current functional status  Sherry Gutierrez 08/24/2014, 2:51 PM
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
## 2014-08-24 NOTE — Progress Notes (Signed)
Physical Therapy Discharge Summary  Patient Details  Name: Sherry Gutierrez MRN: [id number] Date of Birth: Sep 04, 1950  Patient has met 6 of 6 long term goals due to improved activity tolerance, increased strength and ability to compensate for deficits.  Patient to discharge at a wheelchair level Modified Independent. Patient is able to adequately direct care for stand pivot transfers (steadying of RW for sit>stand) and for bumping wheelchair up/down stairs. Recommend pt utilize stand pivot transfer w/ RW for car transfer and when moving to higher surfaces from w/c, and use slide board transfer for even transfers and when home alone. Pt agreeable with plan and taught back recommendations to therapist  Reasons goals not met: N/A  Recommendation:  Patient will benefit from ongoing skilled PT services in home health setting to continue to advance safe functional mobility, address ongoing impairments in RLE strength and ROM, endurance, pain management, and minimize fall risk.  Equipment: wheelchair with elevating leg rests, slide board, pt previously owns RW from prior TKR  Reasons for discharge: treatment goals met and discharge from hospital  Patient/family agrees with progress made and goals achieved: Yes  PT Discharge Precautions/Restrictions Precautions Precautions: Fall Required Braces or Orthoses: Other Brace/Splint (Bledsoe brace on RLE locked in extension) Restrictions RUE Weight Bearing: Weight bearing as tolerated LUE Weight Bearing: Weight bearing as tolerated RLE Weight Bearing: Non weight bearing LLE Weight Bearing: Weight bearing as tolerated (NWB except for transfers WBAT) Vital Signs Therapy Vitals Temp: 97.8 F (36.6 C) Temp Source: Oral Pulse Rate: 75 Resp: 16 BP: 95/68 mmHg Patient Position (if appropriate): Lying Oxygen Therapy SpO2: 100 % O2 Device: Not Delivered Pain Pain Assessment Pain Assessment: 0-10 Pain Score: 1  Pain Type: Acute pain Pain  Location: Knee Pain Orientation: Right Pain Descriptors / Indicators: Aching Pain Frequency: Intermittent Pain Onset: Gradual Pain Intervention(s): Medication (See eMAR) Vision/Perception  Vision - Assessment Eye Alignment: Within Functional Limits Perception Comments: WFL  Cognition Overall Cognitive Status: Within Functional Limits for tasks assessed Arousal/Alertness: Awake/alert Orientation Level: Oriented X4 Attention: Divided Divided Attention: Appears intact Memory: Appears intact Awareness: Appears intact Problem Solving: Appears intact Safety/Judgment: Appears intact Sensation Sensation Light Touch: Appears Intact Stereognosis: Appears Intact Hot/Cold: Appears Intact Proprioception: Appears Intact Additional Comments: Limited access due to bracing Coordination Gross Motor Movements are Fluid and Coordinated: No Fine Motor Movements are Fluid and Coordinated: Yes (@ BUE) Coordination and Movement Description: Slow movements due to bracing and use of AE Motor  Motor Motor: Within Functional Limits  Mobility Bed Mobility Bed Mobility: Supine to Sit;Sit to Supine Supine to Sit: 6: Modified independent (Device/Increase time) Sit to Supine: 6: Modified independent (Device/Increase time) Transfers Transfers: Yes Sit to Stand: 6: Modified independent (Device/Increase time) Stand to Sit: 6: Modified independent (Device/Increase time) Stand Pivot Transfers: 5: Supervision Stand Pivot Transfer Details (indicate cue type and reason): assist to steady RW Lateral/Scoot Transfers: 6: Modified independent (Device/Increase time);With slide board Locomotion  Ambulation Ambulation: No Ambulation/Gait Assistance: Not tested (comment) (due to [place] restrictions) Gait Gait: No Stairs / Additional Locomotion Stairs: Yes Stairs Assistance: 1: +2 Total assist Stairs Assistance Details (indicate cue type and reason): Pt directs care for bumping w/c up/down stairs Stair  Management Technique: Catering manager Mobility: Yes Wheelchair Assistance: 6: Modified independent (Device/Increase time) Environmental health practitioner: Both upper extremities Wheelchair Parts Management: Independent  Trunk/Postural Assessment  Cervical Assessment Cervical Assessment: Within Functional Limits Thoracic Assessment Thoracic Assessment: Within Functional Limits Lumbar Assessment Lumbar Assessment: Within Functional Limits Postural Control Postural  Control: Within Functional Limits  Balance Static Sitting Balance Static Sitting - Level of Assistance: 6: Modified independent (Device/Increase time) Dynamic Sitting Balance Dynamic Sitting - Level of Assistance: 6: Modified independent (Device/Increase time) Static Standing Balance Static Standing - Level of Assistance: 5: Stand by assistance Extremity Assessment  RUE Assessment RUE Assessment: Within Functional Limits LUE Assessment LUE Assessment: Within Functional Limits RLE Assessment RLE Assessment: Exceptions to [facility] RLE Strength RLE Overall Strength Comments: Knee/ankle NT, hip at least 3/5 LLE Assessment LLE Assessment: Exceptions to [facility] LLE Strength LLE Overall Strength Comments: Hip/knee overall 4+/5 but ankle NT  See FIM for current functional status  Rada Hay 08/24/2014, 4:22 PM

## 2014-08-24 NOTE — Progress Notes (Signed)
Orthopedic Tech Progress Note Patient Details:  Sherry Gutierrez Jun 06, 1951 [id number] Brace order completed by [employer] vendor. Patient ID: Sherry Gutierrez, female   DOB: Jun 06, 1951, 64 y.o.   MRN: [id number]   [name], [name] Craig 08/24/2014, 4:33 PM
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
## 2014-08-25 ENCOUNTER — Inpatient Hospital Stay (HOSPITAL_COMMUNITY): Payer: BC Managed Care – PPO

## 2014-08-25 MED ORDER — ACETAMINOPHEN 325 MG PO TABS: 325.0000 mg | ORAL_TABLET | ORAL | Status: DC | PRN

## 2014-08-25 MED ORDER — ENOXAPARIN SODIUM 30 MG/0.3ML ~~LOC~~ SOLN
30.0000 mg | Freq: Once | SUBCUTANEOUS | Status: AC
  Administered 2014-08-25: 30 mg via SUBCUTANEOUS
  Filled 2014-08-25: qty 0.3

## 2014-08-25 MED ORDER — METHOCARBAMOL 500 MG PO TABS: 500.0000 mg | ORAL_TABLET | Freq: Four times a day (QID) | ORAL | Status: DC | PRN

## 2014-08-25 MED ORDER — TRAMADOL HCL 50 MG PO TABS: 50.0000 mg | ORAL_TABLET | Freq: Four times a day (QID) | ORAL | Status: DC | PRN

## 2014-08-25 MED ORDER — ENOXAPARIN SODIUM 30 MG/0.3ML ~~LOC~~ SOLN: 30.0000 mg | Freq: Two times a day (BID) | SUBCUTANEOUS | Status: DC

## 2014-08-25 NOTE — Progress Notes (Signed)
Social Work  Discharge Note  The overall goal for the admission was met for:   Discharge location: Yes - home with daughter to stay and assist  Length of Stay: Yes - 13 days  Discharge activity level: Yes - modified independent from w/c  Home/community participation: Yes  Services provided included: MD, RD, PT, OT, RN, TR, Pharmacy and [place]: Private Insurance: [place] BCBS  Follow-up services arranged: Home Health: PT, OT via [place], DME: 20x18 lightweight w/c with ELRs and back cushion, seat cushion, semi - electric hospital bed, drop arm commode and 30" tf board via [place] and Patient/Family has no preference for HH/DME agencies  Comments (or additional information):  Patient/Family verbalized understanding of follow-up arrangements: Yes  Individual responsible for coordination of the follow-up plan: patient  Confirmed correct DME delivered: Swayze Pries 08/25/2014    Nelson Noone
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
## 2014-08-25 NOTE — Discharge Instructions (Signed)
Inpatient Rehab Discharge Instructions  Sherry Gutierrez Discharge date and time: 08/25/14   Activities/Precautions/ Functional Status: Activity: No walking. No weight on right leg. May weight bear on left foot for transfers only.  Diet: regular diet Wound Care: keep wound clean and dry Functional status:  ___ No restrictions     ___ Walk up steps independently ___ 24/7 supervision/assistance   ___ Walk up steps with assistance _X__ Intermittent supervision/assistance  ___ Bathe/dress independently ___ Walk with walker     _X__ Bathe/dress with assistance ___ Walk Independently    ___ Shower independently ___ Walk with assistance    ___ Shower with assistance _X__ No alcohol     ___ Return to work/school ________    COMMUNITY REFERRALS UPON DISCHARGE:    Home Health:   PT   OT      RN                      Agency:  [facility] Phone: [phone]   Medical Equipment/Items Ordered:  Wheelchair, cushion, hospital bed, drop arm commode and transfer board                                                       Agency/Supplier: [facility] @ [phone]   Special Instructions: 1. Keep legs elevated when seated.  2. Keep Cast shoe and Knee brace on at all times.   My questions have been answered and I understand these instructions. I will adhere to these goals and the provided educational materials after my discharge from the hospital.  Patient/Caregiver Signature _______________________________ Date __________  Clinician Signature _______________________________________ Date __________  Please bring this form and your medication list with you to all your follow-up doctor's appointments.
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
## 2014-08-25 NOTE — Progress Notes (Signed)
Patient received discharge instructions from Delle ReiningPamela Love, PA-C with verbal understanding. Patient performed successful return demonstration of the afternoon sq Lovenox injection to this RN before discharge. Patient was discharged to home with family and belongings.

## 2014-08-25 NOTE — Progress Notes (Signed)
Pt reported to RN that she was scratching her leg under her brace and that the area was bleeding. RN inspected the area and noticed a small scabbed bleeding area on her R leg on the mid thigh. RN cleansed the area and covered with tegaderm.

## 2014-08-27 ENCOUNTER — Telehealth: Payer: Self-pay | Admitting: Physical Medicine & Rehabilitation

## 2014-08-27 NOTE — Telephone Encounter (Signed)
[facility] to let [facility] know that paient is on service with them.  Any questions please call her at [phone]

## 2014-09-16 DIAGNOSIS — Z041 Encounter for examination and observation following transport accident: Secondary | ICD-10-CM

## 2014-09-16 DIAGNOSIS — M84369A Stress fracture, unspecified tibia and fibula, initial encounter for fracture: Secondary | ICD-10-CM

## 2014-09-16 DIAGNOSIS — D689 Coagulation defect, unspecified: Secondary | ICD-10-CM

## 2015-06-29 ENCOUNTER — Other Ambulatory Visit: Payer: Self-pay

## 2015-06-29 DIAGNOSIS — Z1231 Encounter for screening mammogram for malignant neoplasm of breast: Secondary | ICD-10-CM

## 2015-08-04 ENCOUNTER — Ambulatory Visit
Admission: RE | Admit: 2015-08-04 | Discharge: 2015-08-04 | Disposition: A | Discharge status: Home / Self Care | Payer: BC Managed Care – PPO | Source: Ambulatory Visit

## 2015-08-04 DIAGNOSIS — Z1231 Encounter for screening mammogram for malignant neoplasm of breast: Secondary | ICD-10-CM

## 2016-02-13 ENCOUNTER — Encounter (HOSPITAL_COMMUNITY): Payer: Self-pay | Admitting: *Deleted

## 2016-02-13 ENCOUNTER — Observation Stay (HOSPITAL_COMMUNITY)
Admission: EM | Admit: 2016-02-13 | Discharge: 2016-02-14 | Disposition: A | Discharge status: Home / Self Care | Payer: BC Managed Care – PPO | Attending: Internal Medicine | Admitting: Internal Medicine

## 2016-02-13 ENCOUNTER — Emergency Department (HOSPITAL_COMMUNITY): Payer: BC Managed Care – PPO

## 2016-02-13 DIAGNOSIS — Z79899 Other long term (current) drug therapy: Secondary | ICD-10-CM | POA: Diagnosis not present

## 2016-02-13 DIAGNOSIS — Z7982 Long term (current) use of aspirin: Secondary | ICD-10-CM | POA: Insufficient documentation

## 2016-02-13 DIAGNOSIS — R079 Chest pain, unspecified: Secondary | ICD-10-CM | POA: Diagnosis not present

## 2016-02-13 DIAGNOSIS — Z87891 Personal history of nicotine dependence: Secondary | ICD-10-CM | POA: Insufficient documentation

## 2016-02-13 DIAGNOSIS — R55 Syncope and collapse: Secondary | ICD-10-CM | POA: Diagnosis present

## 2016-02-13 DIAGNOSIS — I10 Essential (primary) hypertension: Secondary | ICD-10-CM | POA: Diagnosis present

## 2016-02-13 DIAGNOSIS — Z96652 Presence of left artificial knee joint: Secondary | ICD-10-CM | POA: Insufficient documentation

## 2016-02-13 DIAGNOSIS — R072 Precordial pain: Principal | ICD-10-CM | POA: Insufficient documentation

## 2016-02-13 LAB — CBC
HEMATOCRIT: 40.4 % (ref 36.0–46.0)
HEMOGLOBIN: 12.6 g/dL (ref 12.0–15.0)
MCH: 28.2 pg (ref 26.0–34.0)
MCHC: 31.2 g/dL (ref 30.0–36.0)
MCV: 90.4 fL (ref 78.0–100.0)
PLATELETS: 295 10*3/uL (ref 150–400)
RBC: 4.47 MIL/uL (ref 3.87–5.11)
RDW: 14.1 % (ref 11.5–15.5)
WBC: 5.6 10*3/uL (ref 4.0–10.5)

## 2016-02-13 LAB — PROTIME-INR
INR: 1 (ref 0.00–1.49)
Prothrombin Time: 13.4 seconds (ref 11.6–15.2)

## 2016-02-13 LAB — I-STAT TROPONIN, ED: Troponin i, poc: 0 ng/mL (ref 0.00–0.08)

## 2016-02-13 LAB — BASIC METABOLIC PANEL WITH GFR
Anion gap: 8 (ref 5–15)
BUN: 17 mg/dL (ref 6–20)
CO2: 27 mmol/L (ref 22–32)
Calcium: 9.7 mg/dL (ref 8.9–10.3)
Chloride: 103 mmol/L (ref 101–111)
Creatinine, Ser: 0.84 mg/dL (ref 0.44–1.00)
GFR calc Af Amer: >60 mL/min
GFR calc non Af Amer: >60 mL/min
Glucose, Bld: 92 mg/dL (ref 65–99)
Potassium: 3.9 mmol/L (ref 3.5–5.1)
Sodium: 138 mmol/L (ref 135–145)

## 2016-02-13 LAB — TROPONIN I

## 2016-02-13 MED ORDER — LISINOPRIL 20 MG PO TABS
20.0000 mg | ORAL_TABLET | Freq: Every day | ORAL | Status: DC
  Administered 2016-02-14: 20 mg via ORAL
  Filled 2016-02-13: qty 1

## 2016-02-13 MED ORDER — NITROGLYCERIN 0.4 MG SL SUBL: 0.4000 mg | SUBLINGUAL_TABLET | SUBLINGUAL | Status: DC | PRN

## 2016-02-13 MED ORDER — HEPARIN SODIUM (PORCINE) 5000 UNIT/ML IJ SOLN
5000.0000 [IU] | Freq: Three times a day (TID) | INTRAMUSCULAR | Status: DC
  Administered 2016-02-14: 5000 [IU] via SUBCUTANEOUS

## 2016-02-13 MED ORDER — LISINOPRIL-HYDROCHLOROTHIAZIDE 20-12.5 MG PO TABS: 1.0000 | ORAL_TABLET | Freq: Every day | ORAL | Status: DC

## 2016-02-13 MED ORDER — ADULT MULTIVITAMIN W/MINERALS CH
1.0000 | ORAL_TABLET | Freq: Every day | ORAL | Status: DC
  Administered 2016-02-14: 1 via ORAL
  Filled 2016-02-13: qty 1

## 2016-02-13 MED ORDER — MORPHINE SULFATE (PF) 2 MG/ML IV SOLN: 2.0000 mg | INTRAVENOUS | Status: DC | PRN

## 2016-02-13 MED ORDER — ALPRAZOLAM 0.25 MG PO TABS: 0.2500 mg | ORAL_TABLET | Freq: Two times a day (BID) | ORAL | Status: DC | PRN

## 2016-02-13 MED ORDER — ASPIRIN EC 81 MG PO TBEC
81.0000 mg | DELAYED_RELEASE_TABLET | Freq: Every day | ORAL | Status: DC
  Administered 2016-02-14: 81 mg via ORAL
  Filled 2016-02-13: qty 1

## 2016-02-13 MED ORDER — PHENTERMINE HCL 37.5 MG PO TABS: 37.5000 mg | ORAL_TABLET | Freq: Every day | ORAL | Status: DC

## 2016-02-13 MED ORDER — ONDANSETRON HCL 4 MG/2ML IJ SOLN: 4.0000 mg | Freq: Four times a day (QID) | INTRAMUSCULAR | Status: DC | PRN

## 2016-02-13 MED ORDER — SODIUM CHLORIDE 0.9 % IV SOLN
INTRAVENOUS | Status: DC
  Administered 2016-02-13: 23:00:00 via INTRAVENOUS

## 2016-02-13 MED ORDER — METHOCARBAMOL 500 MG PO TABS: 500.0000 mg | ORAL_TABLET | Freq: Four times a day (QID) | ORAL | Status: DC | PRN

## 2016-02-13 MED ORDER — MELOXICAM 7.5 MG PO TABS
15.0000 mg | ORAL_TABLET | Freq: Every day | ORAL | Status: DC
  Administered 2016-02-14: 15 mg via ORAL
  Filled 2016-02-13: qty 2
  Filled 2016-02-13: qty 1

## 2016-02-13 MED ORDER — ACETAMINOPHEN 325 MG PO TABS: 650.0000 mg | ORAL_TABLET | ORAL | Status: DC | PRN

## 2016-02-13 MED ORDER — HYDROCHLOROTHIAZIDE 12.5 MG PO CAPS
12.5000 mg | ORAL_CAPSULE | Freq: Every day | ORAL | Status: DC
  Administered 2016-02-14: 12.5 mg via ORAL
  Filled 2016-02-13: qty 1

## 2016-02-13 MED ORDER — TRAMADOL HCL 50 MG PO TABS: 50.0000 mg | ORAL_TABLET | Freq: Four times a day (QID) | ORAL | Status: DC | PRN

## 2016-02-13 MED ORDER — ZOLPIDEM TARTRATE 5 MG PO TABS
5.0000 mg | ORAL_TABLET | Freq: Every evening | ORAL | Status: DC | PRN
  Administered 2016-02-14: 5 mg via ORAL
  Filled 2016-02-13: qty 1

## 2016-02-13 NOTE — ED Notes (Signed)
Patient ambulatory to restroom with steady gait.
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
## 2016-02-13 NOTE — H&P (Signed)
History and Physical    Sherry Gutierrez:[id number] DOB: 01-26-1951 DOA: 02/13/2016  Referring MD/NP/PA:   PCP: Dolan Amen, MD   Patient coming from:  The patient is coming from home.  At baseline, pt is independent for most of ADL.     Chief Complaint: chest pain  HPI: Sherry Gutierrez is a 65 y.o. female with medical history significant of hypertension, arthritis, currently taking phentermine for loosing weight, who presents with chest pain and near syncope.  Patient reports at he has had 3 episodes of chest pain since 4:30 PM. Each episode lasted for approximately 10 minutes. It was located in substernal area, nonradiating, 10 out of 10 in severity. Patient states that she had one episode of near-syncope, but did not pass out, with flushing symptoms. She states that she had 3-hour driving and  traveling 3 weeks ago. She had mild SOB which has resolved currently. No tenderness over calf areas. She does not have unilateral weakness, numbness or tingling sensations. No vision change or hearing loss. Patient does not have nausea, vomiting, abdominal pain, diarrhea, symptoms of UTI. No fever or chills.  ED Course: pt was found to have negative troponin, WBC 5.6, temperature normal, no tachycardia, no tachypnea, electrolytes renal function okay. Chest x-ray is negative. Patient is placed on telemetry bed for observation.  Review of Systems:   General: no fevers, chills, no changes in body weight, has fatigue HEENT: no blurry vision, hearing changes or sore throat Pulm: had dyspnea, mild dry coughing, no wheezing CV: has chest pain, no palpitations Abd: no nausea, vomiting, abdominal pain, diarrhea, constipation GU: no dysuria, burning on urination, increased urinary frequency, hematuria  Ext: no leg edema Neuro: no unilateral weakness, numbness, or tingling, no vision change or hearing loss Skin: no rash MSK: No muscle spasm, no deformity, no limitation of range of movement in  spin Heme: No easy bruising.  Travel history: No recent long distant travel.  Allergy: No Known Allergies  Past Medical History:  Diagnosis Date  . Arthritis    OA LEFT KNEE  . Hypertension     Past Surgical History:  Procedure Laterality Date  . BREAST REDUCTION SURGERY    . EXTERNAL FIXATION LEG Right 07/29/2014  . EXTERNAL FIXATION LEG Right 07/29/2014   Procedure: EXTERNAL FIXATION LEG;  Surgeon: Sheral Apley, MD;  Location: [facility];  Service: Orthopedics;  Laterality: Right;  . EXTERNAL FIXATION REMOVAL Right 08/10/2014   Procedure: REMOVAL EXTERNAL FIXATION LEG;  Surgeon: Sheral Apley, MD;  Location: [facility];  Service: Orthopedics;  Laterality: Right;  . INCISION AND DRAINAGE OF WOUND Right 08/10/2014   Procedure: IRRIGATION AND DEBRIDEMENT WOUND;  Surgeon: Sheral Apley, MD;  Location: [facility];  Service: Orthopedics;  Laterality: Right;  . ORIF ANKLE FRACTURE Right 08/10/2014   Procedure: OPEN REDUCTION INTERNAL FIXATION (ORIF) ANKLE FRACTURE;  Surgeon: Sheral Apley, MD;  Location: [facility];  Service: Orthopedics;  Laterality: Right;  Bimaleolar  . ORIF TIBIA PLATEAU Right 08/10/2014   Procedure: OPEN REDUCTION INTERNAL FIXATION (ORIF) TIBIAL PLATEAU;  Surgeon: Sheral Apley, MD;  Location: [facility];  Service: Orthopedics;  Laterality: Right;  . ORIF TOE FRACTURE Left 08/10/2014   Procedure: OPEN REDUCTION INTERNAL FIXATION (ORIF) METATARSAL (TOE) FRACTURE;  Surgeon: Sheral Apley, MD;  Location: [facility];  Service: Orthopedics;  Laterality: Left;  . SYNDESMOSIS REPAIR Right 08/10/2014   Procedure: ORIF SYNDESMOSIS ANKLE;  Surgeon: Sheral Apley, MD;  Location: [facility] OR;  Service:  Orthopedics;  Laterality: Right;  . TONSILLECTOMY     AGE 73  . TOTAL KNEE ARTHROPLASTY Left 01/18/2014   Procedure: LEFT TOTAL KNEE ARTHROPLASTY;  Surgeon: Shelda Pal, MD;  Location: [facility];  Service: Orthopedics;  Laterality: Left;    Social History:  reports that she has quit smoking. She has  never used smokeless tobacco. She reports that she drinks alcohol. She reports that she does not use drugs.  Family History:  Family History  Problem Relation Age of Onset  . Heart attack Father   . Diabetes Mellitus I Father   . Heart attack Brother      Prior to Admission medications   Medication Sig Start Date End Date Taking? Authorizing Provider  acetaminophen (TYLENOL) 325 MG tablet Take 1-2 tablets (325-650 mg total) by mouth every 4 (four) hours as needed for mild pain. 08/25/14  Yes Evlyn Kanner Love, PA-C  aspirin EC 81 MG tablet Take 81 mg by mouth daily.   Yes Historical Provider, MD  lisinopril-hydrochlorothiazide (PRINZIDE,ZESTORETIC) 20-12.5 MG tablet Take 1 tablet by mouth daily. 12/20/15  Yes Historical Provider, MD  meloxicam (MOBIC) 15 MG tablet Take 1 tablet by mouth daily. 01/11/16  Yes Historical Provider, MD  Multiple Vitamin (MULTIVITAMIN WITH MINERALS) TABS tablet Take 1 tablet by mouth daily.   Yes Historical Provider, MD  phentermine (ADIPEX-P) 37.5 MG tablet Take 37.5 mg by mouth daily. 01/25/16  Yes Historical Provider, MD  enoxaparin (LOVENOX) 30 MG/0.3ML injection Inject 0.3 mLs (30 mg total) into the skin every 12 (twelve) hours. 08/25/14   Jacquelynn Cree, PA-C  methocarbamol (ROBAXIN) 500 MG tablet Take 1 tablet (500 mg total) by mouth every 6 (six) hours as needed for muscle spasms. 08/25/14   Evlyn Kanner Love, PA-C  polyethylene glycol (MIRALAX / GLYCOLAX) packet Take 17 g by mouth 2 (two) times daily. Patient not taking: Reported on 07/29/2014 01/19/14   Lanney Gins, PA-C  traMADol (ULTRAM) 50 MG tablet Take 1 tablet (50 mg total) by mouth every 6 (six) hours as needed for moderate pain. 08/25/14   Jacquelynn Cree, PA-C    Physical Exam: Vitals:   02/13/16 2100 02/13/16 2115 02/13/16 2229 02/14/16 0458  BP: 138/69 124/57 128/80 115/62  Pulse: 63 65 62 79  Resp: Temp:   97.9 F (36.6 C) 98 F (36.7 C)  TempSrc:   Oral Oral  SpO2: 99% 99% 99%   Weight:    98.2 kg (216 lb 9.6 oz) 98 kg (216 lb)  Height:    (1.676 m)    General: Not in acute distress HEENT:       Eyes: PERRL, EOMI, no scleral icterus.       ENT: No discharge from the ears and nose, no pharynx injection, no tonsillar enlargement.        Neck: No JVD, no bruit, no mass felt. Heme: No neck lymph node enlargement. Cardiac: S1/S2, RRR, No murmurs, No gallops or rubs. Pulm: No rales, wheezing, rhonchi or rubs. Abd: Soft, nondistended, nontender, no rebound pain, no organomegaly, BS present. GU: No hematuria Ext: No pitting leg edema bilaterally. 2+DP/PT pulse bilaterally. Musculoskeletal: No joint deformities, No joint redness or warmth, no limitation of ROM in spin. Skin: No rashes.  Neuro: Alert, oriented X3, cranial nerves II-XII grossly intact, moves all extremities normally. Muscle strength 5/5 in all extremities, sensation to light touch intact. Brachial reflex 2+ bilaterally. Negative Babinski's sign. Normal finger to nose test. Psych:  Patient is not psychotic, no suicidal or hemocidal ideation.  Labs on Admission: I have personally reviewed following labs and imaging studies  CBC:  Recent Labs Lab 02/13/16 1817  WBC 5.6  HGB 12.6  HCT 40.4  MCV 90.4  PLT 295   Basic Metabolic Panel:  Recent Labs Lab 02/13/16 1817  NA 138  K 3.9  CL 103  CO2 27  GLUCOSE 92  BUN 17  CREATININE 0.84  CALCIUM 9.7   GFR: Estimated Creatinine Clearance: 80.9 mL/min (by C-G formula based on SCr of 0.84 mg/dL). Liver Function Tests: No results for input(s): AST, ALT, ALKPHOS, BILITOT, PROT, ALBUMIN in the last 168 hours. No results for input(s): LIPASE, AMYLASE in the last 168 hours. No results for input(s): AMMONIA in the last 168 hours. Coagulation Profile:  Recent Labs Lab 02/13/16 2308  INR 1.00   Cardiac Enzymes:  Recent Labs Lab 02/13/16 2208 02/14/16 0256  TROPONINI <0.03 <0.03   BNP (last 3 results) No results for input(s): PROBNP in the last  8760 hours. HbA1C: No results for input(s): HGBA1C in the last 72 hours. CBG: No results for input(s): GLUCAP in the last 168 hours. Lipid Profile:  Recent Labs  02/14/16 0256  CHOL 165  HDL 73  LDLCALC 81  TRIG 53  CHOLHDL 2.3   Thyroid Function Tests: No results for input(s): TSH, T4TOTAL, FREET4, T3FREE, THYROIDAB in the last 72 hours. Anemia Panel: No results for input(s): VITAMINB12, FOLATE, FERRITIN, TIBC, IRON, RETICCTPCT in the last 72 hours. Urine analysis:    Component Value Date/Time   COLORURINE YELLOW 07/29/2014 2225   APPEARANCEUR CLEAR 07/29/2014 2225   LABSPEC 1.028 07/29/2014 2225   PHURINE 5.0 07/29/2014 2225   GLUCOSEU NEGATIVE 07/29/2014 2225   HGBUR NEGATIVE 07/29/2014 2225   BILIRUBINUR NEGATIVE 07/29/2014 2225   KETONESUR NEGATIVE 07/29/2014 2225   PROTEINUR NEGATIVE 07/29/2014 2225   UROBILINOGEN 0.2 07/29/2014 2225   NITRITE NEGATIVE 07/29/2014 2225   LEUKOCYTESUR SMALL (A) 07/29/2014 2225   Sepsis Labs: @LABRCNTIP (procalcitonin:4,lacticidven:4) )No results found for this or any previous visit (from the past 240 hour(s)).   Radiological Exams on Admission: Dg Chest 2 View  Result Date: 02/13/2016 CLINICAL DATA:  Mid chest pain with shortness of breath. History of hypertension. EXAM: CHEST  2 VIEW COMPARISON:  07/30/2014 and 01/14/2014. FINDINGS: The heart size and mediastinal contours are normal. The lungs are clear. There is no pleural effusion or pneumothorax. No acute osseous findings are identified. There are degenerative changes throughout the thoracic spine. EKG snap overlies the upper left chest. IMPRESSION: No active cardiopulmonary process. Electronically Signed   By: Carey Bullocks M.D.   On: 02/13/2016 19:03    EKG: Independently reviewed. Sinus rhythm, QTC 445, T-wave inversion in lead 3, T-wave flattening in aVF and V3-V6.  Assessment/Plan Principal Problem:   Chest pain Active Problems:   Essential hypertension   Near  syncope   Chest pain: pt presents with atypical chest pain. No pneumonia on chest x-ray. Patient had mild shortness of breath earlier, which has resolved. No signs of DVT, less likely to have PE. Initial troponin is negative. Given her old age and history of hypertension, will work up for chest pain rule out.   will place on Tele bed for obs - cycle CE q6 x3 and repeat her EKG in the am  - Nitroglycerin, Morphine, and aspirin - Risk factor stratification: will check FLP and A1C  - 2d echo  HTN: Bp 113/72 -continue Prinzid  Near  syncope: Etiology is not clear. No focal neurologic findings on physical examination. May be related to chest pain. Less likely to have stroke. -PT/OT -IVF: NS 75 cc/h   DVT ppx: SQ Heparin (if pt develops severe chest pain or significantly elevated trop, will be easier to switch to IV heparin or stop heparin for procedure than using Lovenox).  Code Status: Full code Family Communication: None at bed side.  Disposition Plan:  Anticipate discharge back to previous home environment Consults called:  none Admission status: Obs / tele       Date of Service 02/14/2016    Lorretta Harp [facility] Pager [phone]  If 7PM-7AM, please contact night-coverage [email or web address] Password [facility] 02/14/2016, 6:47 AM

## 2016-02-13 NOTE — ED Notes (Signed)
Patient transported to x-ray. ?

## 2016-02-13 NOTE — ED Notes (Signed)
Report handed off to [place], [place] [address]

## 2016-02-13 NOTE — ED Notes (Signed)
MD at bedside. 

## 2016-02-13 NOTE — ED Notes (Signed)
Admitting MD at bedside.

## 2016-02-14 ENCOUNTER — Observation Stay (HOSPITAL_BASED_OUTPATIENT_CLINIC_OR_DEPARTMENT_OTHER): Payer: BC Managed Care – PPO

## 2016-02-14 ENCOUNTER — Observation Stay (HOSPITAL_COMMUNITY): Payer: BC Managed Care – PPO

## 2016-02-14 ENCOUNTER — Encounter (HOSPITAL_COMMUNITY): Payer: Self-pay | Admitting: Internal Medicine

## 2016-02-14 DIAGNOSIS — R0789 Other chest pain: Secondary | ICD-10-CM

## 2016-02-14 DIAGNOSIS — R55 Syncope and collapse: Secondary | ICD-10-CM | POA: Diagnosis not present

## 2016-02-14 DIAGNOSIS — R072 Precordial pain: Secondary | ICD-10-CM | POA: Diagnosis not present

## 2016-02-14 DIAGNOSIS — R079 Chest pain, unspecified: Secondary | ICD-10-CM

## 2016-02-14 DIAGNOSIS — I10 Essential (primary) hypertension: Secondary | ICD-10-CM

## 2016-02-14 LAB — URINE MICROSCOPIC-ADD ON

## 2016-02-14 LAB — URINALYSIS, ROUTINE W REFLEX MICROSCOPIC
Bilirubin Urine: NEGATIVE
GLUCOSE, UA: NEGATIVE mg/dL
HGB URINE DIPSTICK: NEGATIVE
KETONES UR: NEGATIVE mg/dL
Nitrite: NEGATIVE
PROTEIN: NEGATIVE mg/dL
Specific Gravity, Urine: 1.024 (ref 1.005–1.030)
pH: 5.5 (ref 5.0–8.0)

## 2016-02-14 LAB — LIPID PANEL
CHOLESTEROL: 165 mg/dL (ref 0–200)
HDL: 73 mg/dL (ref >40)
LDL Cholesterol: 81 mg/dL (ref 0–99)
TRIGLYCERIDES: 53 mg/dL (ref <150)
Total CHOL/HDL Ratio: 2.3 RATIO
VLDL: 11 mg/dL (ref 0–40)

## 2016-02-14 LAB — NM MYOCAR MULTI W/SPECT W/WALL MOTION / EF
CHL CUP NUCLEAR SSS: 7
CHL CUP RESTING HR STRESS: 55 {beats}/min
CHL CUP STRESS STAGE 1 GRADE: 0 %
CHL CUP STRESS STAGE 1 HR: 60 {beats}/min
CHL CUP STRESS STAGE 1 SPEED: 0 mph
CHL CUP STRESS STAGE 3 DBP: 79 mmHg
CHL CUP STRESS STAGE 3 GRADE: 0 %
CHL CUP STRESS STAGE 3 SPEED: 0 mph
CHL CUP STRESS STAGE 4 SBP: 126 mmHg
CHL CUP STRESS STAGE 4 SPEED: 0 mph
LHR: 0
LV dias vol: 79 mL (ref 46–106)
LVSYSVOL: 27 mL
NUC STRESS TID: 1.29
SDS: 3
SRS: 4
Stage 2 Grade: 0 %
Stage 2 HR: 60 {beats}/min
Stage 2 Speed: 0 mph
Stage 3 HR: 75 {beats}/min
Stage 3 SBP: 127 mmHg
Stage 4 DBP: 80 mmHg
Stage 4 Grade: 0 %
Stage 4 HR: 72 {beats}/min

## 2016-02-14 LAB — TROPONIN I

## 2016-02-14 LAB — D-DIMER, QUANTITATIVE (NOT AT ARMC): D DIMER QUANT: 0.64 ug{FEU}/mL — AB (ref 0.00–0.50)

## 2016-02-14 MED ORDER — TECHNETIUM TC 99M TETROFOSMIN IV KIT
10.0000 | PACK | Freq: Once | INTRAVENOUS | Status: AC | PRN
  Administered 2016-02-14: 10 via INTRAVENOUS

## 2016-02-14 MED ORDER — PHENTERMINE HCL 37.5 MG PO TABS: 37.5000 mg | ORAL_TABLET | Freq: Every day | ORAL | Status: DC

## 2016-02-14 MED ORDER — TECHNETIUM TC 99M TETROFOSMIN IV KIT
30.0000 | PACK | Freq: Once | INTRAVENOUS | Status: AC | PRN
  Administered 2016-02-14: 30 via INTRAVENOUS

## 2016-02-14 MED ORDER — IOPAMIDOL (ISOVUE-370) INJECTION 76%
INTRAVENOUS | Status: AC
  Administered 2016-02-14: 100 mL
  Filled 2016-02-14: qty 100

## 2016-02-14 MED ORDER — REGADENOSON 0.4 MG/5ML IV SOLN
0.4000 mg | Freq: Once | INTRAVENOUS | Status: AC
Start: 2016-02-14 — End: 2016-02-14
  Administered 2016-02-14: 0.4 mg via INTRAVENOUS

## 2016-02-14 MED ORDER — REGADENOSON 0.4 MG/5ML IV SOLN
INTRAVENOUS | Status: AC
  Filled 2016-02-14: qty 5

## 2016-02-14 MED ORDER — NITROGLYCERIN 0.4 MG SL SUBL: 0.4000 mg | SUBLINGUAL_TABLET | SUBLINGUAL | 12 refills | Status: DC | PRN

## 2016-02-14 NOTE — Progress Notes (Signed)
Pt discharged home. Discharge instructions have been gone over with the patient. IV's removed. Pt given unit number and told to call if they have any concerns regarding their discharge instructions. Rolinda Impson V, RN   

## 2016-02-14 NOTE — Discharge Instructions (Signed)
Angina Pectoris  Angina pectoris, often called angina, is extreme discomfort in the chest, neck, or arm. This is caused by a lack of blood in the middle and thickest layer of the heart wall (myocardium). There are four types of angina:  · Stable angina. Stable angina usually occurs in episodes of predictable frequency and duration. It is usually brought on by physical activity, stress, or excitement. Stable angina usually lasts a few minutes and can often be relieved by a medicine that you place under your tongue. This medicine is called sublingual nitroglycerin.  · Unstable angina. Unstable angina can occur even when you are doing little or no physical activity. It can even occur while you are sleeping or when you are at rest. It can suddenly increase in severity or frequency. It may not be relieved by sublingual nitroglycerin, and it can last up to 30 minutes.  · Microvascular angina. This type of angina is caused by a disorder of tiny blood vessels called arterioles. Microvascular angina is more common in women. The pain may be more severe and last longer than other types of angina pectoris.  · Prinzmetal or variant angina. This type of angina pectoris is rare and usually occurs when you are doing little or no physical activity. It especially occurs in the early morning hours.  CAUSES  Atherosclerosis is the cause of angina. This is the buildup of fat and cholesterol (plaque) on the inside of the arteries. Over time, the plaque may narrow or block the artery, and this will lessen blood flow to the heart. Plaque can also become weak and break off within a coronary artery to form a clot and cause a sudden blockage.  RISK FACTORS  Risk factors common to both men and women include:  · High cholesterol levels.  · High blood pressure (hypertension).  · Tobacco use.  · Diabetes.  · Family history of angina.  · Obesity.  · Lack of exercise.  · A diet high in saturated fats.  Women are at greater risk for angina if they  are:  · Over age 55.  · Postmenopausal.  SYMPTOMS  Many people do not experience any symptoms during the early stages of angina. As the condition progresses, symptoms common to both men and women may include:  · Chest pain.    The pain can be described as a crushing or squeezing in the chest, or a tightness, pressure, fullness, or heaviness in the chest.    The pain can last more than a few minutes, or it can stop and recur.  · Pain in the arms, neck, jaw, or back.  · Unexplained heartburn or indigestion.  · Shortness of breath.  · Nausea.  · Sudden cold sweats.  · Sudden light-headedness.  Many women have chest discomfort and some of the other symptoms. However, women often have different (atypical) symptoms, such as:   · Fatigue.  · Unexplained feelings of nervousness or anxiety.  · Unexplained weakness.  · Dizziness or fainting.  Sometimes, women may have angina without any symptoms.  DIAGNOSIS   Tests to diagnose angina may include:  · ECG (electrocardiogram).  · Exercise stress test. This looks for signs of blockage when the heart is being exercised.  · Pharmacologic stress test. This test looks for signs of blockage when the heart is being stressed with a medicine.  · Blood tests.  · Coronary angiogram. This is a procedure to look at the coronary arteries to see if there is any blockage.    TREATMENT   The treatment of angina may include the following:  · Healthy behavioral changes to reduce or control risk factors.  · Medicine.  · Coronary stenting. A stent helps to keep an artery open.  · Coronary angioplasty. This procedure widens a narrowed or blocked artery.  · Coronary artery bypass surgery. This will allow your blood to pass the blockage (bypass) to reach your heart.  HOME CARE INSTRUCTIONS   · Take medicines only as directed by your health care provider.  · Do not take the following medicines unless your health care provider approves:    Nonsteroidal anti-inflammatory drugs (NSAIDs), such as ibuprofen,  naproxen, or celecoxib.    Vitamin supplements that contain vitamin A, vitamin E, or both.    Hormone replacement therapy that contains estrogen with or without progestin.  · Manage other health conditions such as hypertension and diabetes as directed by your health care provider.  · Follow a heart-healthy diet. A dietitian can help to educate you about healthy food options and changes.  · Use healthy cooking methods such as roasting, grilling, broiling, baking, poaching, steaming, or stir-frying. Talk to a dietitian to learn more about healthy cooking methods.  · Follow an exercise program approved by your health care provider.  · Maintain a healthy weight. Lose weight as approved by your health care provider.  · Plan rest periods when fatigued.  · Learn to manage stress.  · Do not use any tobacco products, including cigarettes, chewing tobacco, or electronic cigarettes. If you need help quitting, ask your health care provider.  · If you drink alcohol, and your health care provider approves, limit your alcohol intake to no more than 1 drink per day. One drink equals 12 ounces of beer, 5 ounces of wine, or 1½ ounces of hard liquor.  · Stop illegal drug use.  · Keep all follow-up visits as directed by your health care provider. This is important.  SEEK IMMEDIATE MEDICAL CARE IF:   · You have pain in your chest, neck, arm, jaw, stomach, or back that lasts more than a few minutes, is recurring, or is unrelieved by taking sublingual nitroglycerin.  · You have profuse sweating without cause.  · You have unexplained:    Heartburn or indigestion.    Shortness of breath or difficulty breathing.    Nausea or vomiting.    Fatigue.    Feelings of nervousness or anxiety.    Weakness.    Diarrhea.  · You have sudden light-headedness or dizziness.  · You faint.  These symptoms may represent a serious problem that is an emergency. Do not wait to see if the symptoms will go away. Get medical help right away. Call your local  emergency services (911 in the [place] Do not drive yourself to the hospital.     This information is not intended to replace advice given to you by your health care provider. Make sure you discuss any questions you have with your health care provider.     Document Released: 07/09/2005 Document Revised: 07/30/2014 Document Reviewed: 11/10/2013  Elsevier Interactive Patient Education ©2016 [employer]

## 2016-02-14 NOTE — Consult Note (Signed)
CARDIOLOGY CONSULT NOTE   Patient ID: Sherry Gutierrez MRN: [id number], DOB/AGE: 1951-05-04   Admit date: 02/13/2016 Date of Consult: 02/14/2016   Primary Physician: Dolan Amen, MD Primary Cardiologist: new  Pt. Profile  Sherry Gutierrez is a pleasant 65 year old AA female with past medical history of hypertension but no prior cardiac issues presented with 2 episodes of chest pain occurred in the last 3 weeks  Problem List  Past Medical History:  Diagnosis Date  . Arthritis    OA LEFT KNEE  . Hypertension     Past Surgical History:  Procedure Laterality Date  . BREAST REDUCTION SURGERY    . EXTERNAL FIXATION LEG Right 07/29/2014  . EXTERNAL FIXATION LEG Right 07/29/2014   Procedure: EXTERNAL FIXATION LEG;  Surgeon: Sheral Apley, MD;  Location: [facility] OR;  Service: Orthopedics;  Laterality: Right;  . EXTERNAL FIXATION REMOVAL Right 08/10/2014   Procedure: REMOVAL EXTERNAL FIXATION LEG;  Surgeon: Sheral Apley, MD;  Location: [facility] OR;  Service: Orthopedics;  Laterality: Right;  . INCISION AND DRAINAGE OF WOUND Right 08/10/2014   Procedure: IRRIGATION AND DEBRIDEMENT WOUND;  Surgeon: Sheral Apley, MD;  Location: [facility] OR;  Service: Orthopedics;  Laterality: Right;  . ORIF ANKLE FRACTURE Right 08/10/2014   Procedure: OPEN REDUCTION INTERNAL FIXATION (ORIF) ANKLE FRACTURE;  Surgeon: Sheral Apley, MD;  Location: [facility] OR;  Service: Orthopedics;  Laterality: Right;  Bimaleolar  . ORIF TIBIA PLATEAU Right 08/10/2014   Procedure: OPEN REDUCTION INTERNAL FIXATION (ORIF) TIBIAL PLATEAU;  Surgeon: Sheral Apley, MD;  Location: [facility] OR;  Service: Orthopedics;  Laterality: Right;  . ORIF TOE FRACTURE Left 08/10/2014   Procedure: OPEN REDUCTION INTERNAL FIXATION (ORIF) METATARSAL (TOE) FRACTURE;  Surgeon: Sheral Apley, MD;  Location: [facility] OR;  Service: Orthopedics;  Laterality: Left;  . SYNDESMOSIS REPAIR Right 08/10/2014   Procedure: ORIF SYNDESMOSIS ANKLE;  Surgeon: Sheral Apley, MD;   Location: [facility] OR;  Service: Orthopedics;  Laterality: Right;  . TONSILLECTOMY     AGE 8  . TOTAL KNEE ARTHROPLASTY Left 01/18/2014   Procedure: LEFT TOTAL KNEE ARTHROPLASTY;  Surgeon: Shelda Pal, MD;  Location: [facility] ORS;  Service: Orthopedics;  Laterality: Left;     Allergies  No Known Allergies  HPI   Sherry Gutierrez is a pleasant 65 year old AA female with past medical history of hypertension but no prior cardiac issues. She did smoke for 5 years, but quit in 1992. Her father had MI in his 22s. One of her brother had MI when he was 29-56 yo. She has been taking phentermine for weight loss in the past month with noticeable decrease of appetite with that medication. She is fairly independent and currently taking care of her ailing mother. She says it is very stressful for her.   She did have episodic chest pain in the past, however all occurs very randomly and extremely rare. Different episodes of chest pain may have a gap of several years in between. Three weeks ago, while sitting down talking to one of her friend, she had a substernal dull ache. She denies any radiation. She had mild shortness of breath during the episode. She denies any exacerbating factors such as deep inspiration, body rotation or palpation. The dull chest ache lasted roughly 10 minutes before going away. According to the patient, she has not noticed any decline in her functional ability recently. She says she was able to do her yard work yesterday morning without any difficulty. Yesterday, she had a  second episode of substernal chest discomfort. The characteristic is very similar to what occurred 3 weeks ago. The symptom was also accompanied by flushing sensation and weakness. She denies any dizziness or feeling of passing out. Given this recurrent chest discomfort, patient sought medical attention at [facility]. EKG shows nonspecific T-wave changes, otherwise no ischemia. Serial troponin was negative. Chest x-ray was  negative for acute process.  Cardiology service has been consulted for chest pain. Of note, her CT of the chest without contrast obtained in January 2016 has been reviewed, no sign of coronary calcification was noted on previous CT.   Inpatient Medications  . aspirin EC  81 mg Oral Daily  . heparin  5,000 Units Subcutaneous Q8H  . lisinopril  20 mg Oral Daily   And  . hydrochlorothiazide  12.5 mg Oral Daily  . meloxicam  15 mg Oral Daily  . multivitamin with minerals  1 tablet Oral Daily    Family History Family History  Problem Relation Age of Onset  . Heart attack Father   . Diabetes Mellitus I Father   . Heart attack Brother      Social History Social History   Social History  . Marital status: Widowed    Spouse name: N/A  . Number of children: N/A  . Years of education: N/A   Occupational History  . Not on file.   Social History Main Topics  . Smoking status: Former Games developer  . Smokeless tobacco: Never Used     Comment: QUIT SMOKING IN 1982  . Alcohol use Yes     Comment: QUIT SMOKING 1982  OCCAS GLASS OF WINE  . Drug use: No  . Sexual activity: Not on file   Other Topics Concern  . Not on file   Social History Narrative  . No narrative on file     Review of Systems  General:  No chills, fever, night sweats or weight changes.  Cardiovascular:  No dyspnea on exertion, edema, orthopnea, palpitations, paroxysmal nocturnal dyspnea. +chest pain, flushing Dermatological: No rash, lesions/masses Respiratory: No cough, dyspnea Urologic: No hematuria, dysuria Abdominal:   No nausea, vomiting, diarrhea, bright red blood per rectum, melena, or hematemesis Neurologic:  No visual changes, changes in mental status. +wkns All other systems reviewed and are otherwise negative except as noted above.  Physical Exam  Blood pressure 108/72, pulse 63, temperature 97.7 F (36.5 C), temperature source Oral, resp. rate 18, height 5\' 6"  (1.676 m), weight 216 lb (98 kg),  SpO2 93 %.  General: Pleasant, NAD Psych: Normal affect. Neuro: Alert and oriented X 3. Moves all extremities spontaneously. HEENT: Normal  Neck: Supple without bruits or JVD. Lungs:  Resp regular and unlabored, CTA. Heart: RRR no s3, s4, or murmurs. Abdomen: Soft, non-tender, non-distended, BS + x 4.  Extremities: No clubbing, cyanosis or edema. DP/PT/Radials 2+ and equal bilaterally.  Labs   Recent Labs  02/13/16 2208 02/14/16 0256  TROPONINI <0.03 <0.03   Lab Results  Component Value Date   WBC 5.6 02/13/2016   HGB 12.6 02/13/2016   HCT 40.4 02/13/2016   MCV 90.4 02/13/2016   PLT 295 02/13/2016     Recent Labs Lab 02/13/16 1817  NA 138  K 3.9  CL 103  CO2 27  BUN 17  CREATININE 0.84  CALCIUM 9.7  GLUCOSE 92   Lab Results  Component Value Date   CHOL 165 02/14/2016   HDL 73 02/14/2016   LDLCALC 81 02/14/2016   TRIG  53 02/14/2016   No results found for: DDIMER  Radiology/Studies  Dg Chest 2 View  Result Date: 02/13/2016 CLINICAL DATA:  Mid chest pain with shortness of breath. History of hypertension. EXAM: CHEST  2 VIEW COMPARISON:  07/30/2014 and 01/14/2014. FINDINGS: The heart size and mediastinal contours are normal. The lungs are clear. There is no pleural effusion or pneumothorax. No acute osseous findings are identified. There are degenerative changes throughout the thoracic spine. EKG snap overlies the upper left chest. IMPRESSION: No active cardiopulmonary process. Electronically Signed   By: Carey Bullocks M.D.   On: 02/13/2016 19:03   ECG  Normal sinus rhythm, nonspecific T-wave changes, no ischemic changes.  ASSESSMENT AND PLAN  1. Atypical chest pain at rest, no exertional component. Previous CT of chest was negative for coronary calcification. No sign of ACS. Will discuss with M.D., likely inpatient versus outpatient nuclear stress test. She is not sure if she is able to walk on the treadmill as she walk with a limp, however she is willing  to try, if she cannot ambulate on the treadmill, we will switch to Lexiscan.  - pending d-dimer  2. HTN: well controlled.  3. Obesity: on phentermine for 1 month   Signed, Azalee Course, PA-C 02/14/2016, 8:22 AM  I have seen and examined the patient along with Azalee Course, PA-C.  I have reviewed the chart, notes and new data.  I agree with PA/NP's note.  Key new complaints: no new chest pain since admission Key examination changes: normal CV exam Key new findings / data: Nonspecific ECG repol changes, normal troponin  PLAN: Myoview today. Be prepared for likely breast attenuation artifact.  Thurmon Fair, MD, [facility] [phone] 02/14/2016, 9:06 AM

## 2016-02-14 NOTE — Progress Notes (Signed)
     The patient was seen in nuclear medicine for a lexiscan myoview.  She tolerated the procedure well. No acute ST or TW changes on ECG. Await images.   Zitlali Primm Stern PA-C  [facility]   

## 2016-02-14 NOTE — Discharge Summary (Signed)
Physician Discharge Summary   Patient ID: Sherry Gutierrez MRN: [id number] DOB/AGE: 1950/08/03 65 y.o.  Admit date: 02/13/2016 Discharge date: 02/14/2016  Primary Care Physician:  Dolan Amen, MD  Discharge Diagnoses:    . Atypical Chest pain . Essential hypertension . Near syncope   Consults: Cardiology  Recommendations for Outpatient Follow-up:  1. Please repeat CBC/BMET at next visit   DIET: Heart healthy    Allergies:  No Known Allergies   DISCHARGE MEDICATIONS: Current Discharge Medication List    START taking these medications   Details  nitroGLYCERIN (NITROSTAT) 0.4 MG SL tablet Place 1 tablet (0.4 mg total) under the tongue every 5 (five) minutes as needed for chest pain. Qty: 30 tablet, Refills: 12      CONTINUE these medications which have NOT CHANGED   Details  acetaminophen (TYLENOL) 325 MG tablet Take 1-2 tablets (325-650 mg total) by mouth every 4 (four) hours as needed for mild pain.    aspirin EC 81 MG tablet Take 81 mg by mouth daily.    lisinopril-hydrochlorothiazide (PRINZIDE,ZESTORETIC) 20-12.5 MG tablet Take 1 tablet by mouth daily.    meloxicam (MOBIC) 15 MG tablet Take 1 tablet by mouth daily.    Multiple Vitamin (MULTIVITAMIN WITH MINERALS) TABS tablet Take 1 tablet by mouth daily.    phentermine (ADIPEX-P) 37.5 MG tablet Take 37.5 mg by mouth daily. Refills: 0    methocarbamol (ROBAXIN) 500 MG tablet Take 1 tablet (500 mg total) by mouth every 6 (six) hours as needed for muscle spasms. Qty: 30 tablet, Refills: 0    polyethylene glycol (MIRALAX / GLYCOLAX) packet Take 17 g by mouth 2 (two) times daily. Qty: 14 each, Refills: 0    traMADol (ULTRAM) 50 MG tablet Take 1 tablet (50 mg total) by mouth every 6 (six) hours as needed for moderate pain. Qty: 100 tablet, Refills: 0      STOP taking these medications     enoxaparin (LOVENOX) 30 MG/0.3ML injection          Brief H and P: For complete details please refer to  admission H and P, but in brief, Sherry Gutierrez is a 65 y.o. female with medical history significant of hypertension, arthritis, currently taking phentermine for loosing weight, who presents with chest pain and near syncope. Patient reported 3 episodes of chest pain since 4:30 PM the day of admission. Each episode lasted for approximately 10 minutes. It was located in substernal area, nonradiating, 10 out of 10 in severity. Patient states that she had one episode of near-syncope, but did not pass out, with flushing symptoms. She states that she had 3-hour driving and  traveling 3 weeks ago. She had mild SOB which has resolved currently. No tenderness over calf areas. She does not have unilateral weakness, numbness or tingling sensations. No vision change or hearing loss. Patient does not have nausea, vomiting, abdominal pain, diarrhea, symptoms of UTI. No fever or chills. ED Course: pt was found to have negative troponin, WBC 5.6, temperature normal, no tachycardia, no tachypnea, electrolytes renal function okay. Chest x-ray is negative.  Hospital Course:  Atypical chest pain with dizziness/near syncope - Chest pain resolved. Troponins negative. Chest x-ray negative for any pneumonia. EKG showed no acute ST-T wave changes just above ischemia - Lipid panel with cholesterol 165, LDL 81.  - Cardiology was consulted and recommended a nuclear medicine stress test for further workup - Stress test showed EF 66%, no ischemia, low risk study.  - D dimer was slightly  elevated at 0.64, CTA chest showed no pulmonary embolism.  HTN: Bp 113/72 -continue Prinzid  Near syncope: Etiology is not clear. No focal neurologic findings on physical examination. May be related to chest pain. Less likely to have stroke. -Patient was placed on gentle hydration.  Day of Discharge BP 124/69 (BP Location: Right Arm)   Pulse 75   Temp 98.3 F (36.8 C) (Oral)   Resp 18   Ht  (1.676 m)   Wt 98 kg (216 lb)   SpO2  100%   BMI 34.86 kg/m   Physical Exam: General: Alert and awake oriented x3 not in any acute distress. HEENT: anicteric sclera, pupils reactive to light and accommodation CVS: S1-S2 clear no murmur rubs or gallops Chest: clear to auscultation bilaterally, no wheezing rales or rhonchi Abdomen: soft nontender, nondistended, normal bowel sounds Extremities: no cyanosis, clubbing or edema noted bilaterally Neuro: Cranial nerves II-XII intact, no focal neurological deficits   The results of significant diagnostics from this hospitalization (including imaging, microbiology, ancillary and laboratory) are listed below for reference.    LAB RESULTS: Basic Metabolic Panel:  Recent Labs Lab 02/13/16 1817  NA 138  K 3.9  CL 103  CO2 27  GLUCOSE 92  BUN 17  CREATININE 0.84  CALCIUM 9.7   Liver Function Tests: No results for input(s): AST, ALT, ALKPHOS, BILITOT, PROT, ALBUMIN in the last 168 hours. No results for input(s): LIPASE, AMYLASE in the last 168 hours. No results for input(s): AMMONIA in the last 168 hours. CBC:  Recent Labs Lab 02/13/16 1817  WBC 5.6  HGB 12.6  HCT 40.4  MCV 90.4  PLT 295   Cardiac Enzymes:  Recent Labs Lab 02/14/16 0256 02/14/16 0918  TROPONINI <0.03 <0.03   BNP: Invalid input(s): POCBNP CBG: No results for input(s): GLUCAP in the last 168 hours.  Significant Diagnostic Studies:  Dg Chest 2 View  Result Date: 02/13/2016 CLINICAL DATA:  Mid chest pain with shortness of breath. History of hypertension. EXAM: CHEST  2 VIEW COMPARISON:  07/30/2014 and 01/14/2014. FINDINGS: The heart size and mediastinal contours are normal. The lungs are clear. There is no pleural effusion or pneumothorax. No acute osseous findings are identified. There are degenerative changes throughout the thoracic spine. EKG snap overlies the upper left chest. IMPRESSION: No active cardiopulmonary process. Electronically Signed   By: Carey Bullocks M.D.   On: 02/13/2016  19:03  Nuclear medicine stress test:    Defect 1: There is a small defect of mild severity present in the apex location. No ischemia identified.  This is a low risk study.  Nuclear stress EF: 66%.  There was no ST segment deviation noted during stress.   Overall reassuring study. Donato Schultz, MD   Disposition and Follow-up:    DISPOSITION: home    DISCHARGE FOLLOW-UP Follow-up Information    NORWOOD, DOROTHY, MD. Schedule an appointment as soon as possible for a visit in 2 week(s).   Specialty:  Internal Medicine Contact information: [address] [place] [address] [phone]            Time spent on Discharge:   Signed:   RAI,RIPUDEEP M.D. [facility] 02/14/2016, 3:34 PM Pager: [phone]

## 2016-02-15 LAB — HEMOGLOBIN A1C
HEMOGLOBIN A1C: 5.5 % (ref 4.8–5.6)
MEAN PLASMA GLUCOSE: 111 mg/dL
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
## 2016-06-29 ENCOUNTER — Emergency Department (HOSPITAL_COMMUNITY): Payer: BC Managed Care – PPO

## 2016-06-29 ENCOUNTER — Inpatient Hospital Stay (HOSPITAL_COMMUNITY)
Admission: EM | Admit: 2016-06-29 | Discharge: 2016-07-03 | DRG: 482 | Disposition: A | Discharge status: Home / Self Care | Payer: BC Managed Care – PPO | Attending: Orthopedic Surgery | Admitting: Orthopedic Surgery

## 2016-06-29 ENCOUNTER — Encounter (HOSPITAL_COMMUNITY): Payer: Self-pay | Admitting: *Deleted

## 2016-06-29 DIAGNOSIS — M25561 Pain in right knee: Secondary | ICD-10-CM | POA: Diagnosis present

## 2016-06-29 DIAGNOSIS — S72401A Unspecified fracture of lower end of right femur, initial encounter for closed fracture: Secondary | ICD-10-CM | POA: Diagnosis present

## 2016-06-29 DIAGNOSIS — Z7982 Long term (current) use of aspirin: Secondary | ICD-10-CM

## 2016-06-29 DIAGNOSIS — W19XXXA Unspecified fall, initial encounter: Secondary | ICD-10-CM

## 2016-06-29 DIAGNOSIS — Z96652 Presence of left artificial knee joint: Secondary | ICD-10-CM | POA: Diagnosis present

## 2016-06-29 DIAGNOSIS — Z87891 Personal history of nicotine dependence: Secondary | ICD-10-CM

## 2016-06-29 DIAGNOSIS — Z79899 Other long term (current) drug therapy: Secondary | ICD-10-CM

## 2016-06-29 DIAGNOSIS — I10 Essential (primary) hypertension: Secondary | ICD-10-CM | POA: Diagnosis present

## 2016-06-29 DIAGNOSIS — W010XXA Fall on same level from slipping, tripping and stumbling without subsequent striking against object, initial encounter: Secondary | ICD-10-CM | POA: Diagnosis present

## 2016-06-29 DIAGNOSIS — T148XXA Other injury of unspecified body region, initial encounter: Secondary | ICD-10-CM

## 2016-06-29 DIAGNOSIS — S7290XA Unspecified fracture of unspecified femur, initial encounter for closed fracture: Secondary | ICD-10-CM | POA: Diagnosis present

## 2016-06-29 LAB — CBC WITH DIFFERENTIAL/PLATELET
BASOS ABS: 0 10*3/uL (ref 0.0–0.1)
BASOS PCT: 0 %
Eosinophils Absolute: 0 10*3/uL (ref 0.0–0.7)
Eosinophils Relative: 0 %
HEMATOCRIT: 39.7 % (ref 36.0–46.0)
HEMOGLOBIN: 12.5 g/dL (ref 12.0–15.0)
LYMPHS PCT: 12 %
Lymphs Abs: 1.1 10*3/uL (ref 0.7–4.0)
MCH: 28 pg (ref 26.0–34.0)
MCHC: 31.5 g/dL (ref 30.0–36.0)
MCV: 89 fL (ref 78.0–100.0)
MONO ABS: 0.7 10*3/uL (ref 0.1–1.0)
Monocytes Relative: 8 %
NEUTROS ABS: 7.2 10*3/uL (ref 1.7–7.7)
NEUTROS PCT: 80 %
Platelets: 299 10*3/uL (ref 150–400)
RBC: 4.46 MIL/uL (ref 3.87–5.11)
RDW: 14.8 % (ref 11.5–15.5)
WBC: 9.1 10*3/uL (ref 4.0–10.5)

## 2016-06-29 LAB — BASIC METABOLIC PANEL
ANION GAP: 8 (ref 5–15)
BUN: 26 mg/dL — ABNORMAL HIGH (ref 6–20)
CALCIUM: 9.1 mg/dL (ref 8.9–10.3)
CO2: 27 mmol/L (ref 22–32)
Chloride: 104 mmol/L (ref 101–111)
Creatinine, Ser: 0.73 mg/dL (ref 0.44–1.00)
GLUCOSE: 131 mg/dL — AB (ref 65–99)
POTASSIUM: 3.9 mmol/L (ref 3.5–5.1)
Sodium: 139 mmol/L (ref 135–145)

## 2016-06-29 LAB — TYPE AND SCREEN
ABO/RH(D): A POS
ANTIBODY SCREEN: NEGATIVE

## 2016-06-29 MED ORDER — OXYCODONE HCL 5 MG PO TABS
5.0000 mg | ORAL_TABLET | ORAL | Status: DC | PRN
Start: 2016-06-29 — End: 2016-07-03
  Administered 2016-06-29 – 2016-06-30 (×2): 10 mg via ORAL
  Filled 2016-06-29 (×2): qty 2

## 2016-06-29 MED ORDER — ACETAMINOPHEN 500 MG PO TABS
1000.0000 mg | ORAL_TABLET | Freq: Four times a day (QID) | ORAL | Status: DC
  Administered 2016-06-29 – 2016-07-03 (×11): 1000 mg via ORAL
  Filled 2016-06-29 (×13): qty 2

## 2016-06-29 MED ORDER — HYDROCODONE-ACETAMINOPHEN 7.5-325 MG PO TABS
1.0000 | ORAL_TABLET | Freq: Four times a day (QID) | ORAL | Status: DC
  Administered 2016-06-29 – 2016-07-01 (×4): 1 via ORAL
  Filled 2016-06-29 (×7): qty 1

## 2016-06-29 MED ORDER — METHOCARBAMOL 500 MG PO TABS
500.0000 mg | ORAL_TABLET | Freq: Four times a day (QID) | ORAL | Status: DC | PRN
  Administered 2016-07-02: 500 mg via ORAL
  Filled 2016-06-29: qty 1

## 2016-06-29 MED ORDER — HYDROMORPHONE HCL 1 MG/ML IJ SOLN
1.0000 mg | Freq: Once | INTRAMUSCULAR | Status: AC
  Administered 2016-06-29: 1 mg via INTRAVENOUS
  Filled 2016-06-29: qty 1

## 2016-06-29 MED ORDER — ZOLPIDEM TARTRATE 5 MG PO TABS: 5.0000 mg | ORAL_TABLET | Freq: Every evening | ORAL | Status: DC | PRN

## 2016-06-29 MED ORDER — BISACODYL 5 MG PO TBEC: 5.0000 mg | DELAYED_RELEASE_TABLET | Freq: Every day | ORAL | Status: DC | PRN

## 2016-06-29 MED ORDER — HYDROMORPHONE HCL 1 MG/ML IJ SOLN: 1.0000 mg | INTRAMUSCULAR | Status: DC | PRN

## 2016-06-29 MED ORDER — HYDROMORPHONE HCL 1 MG/ML IJ SOLN
1.0000 mg | INTRAMUSCULAR | Status: DC | PRN
  Administered 2016-06-29: 1 mg via INTRAVENOUS
  Filled 2016-06-29: qty 1

## 2016-06-29 MED ORDER — DIPHENHYDRAMINE HCL 12.5 MG/5ML PO ELIX: 12.5000 mg | ORAL_SOLUTION | ORAL | Status: DC | PRN

## 2016-06-29 MED ORDER — SENNOSIDES-DOCUSATE SODIUM 8.6-50 MG PO TABS: 1.0000 | ORAL_TABLET | Freq: Every evening | ORAL | Status: DC | PRN

## 2016-06-29 MED ORDER — HYDROMORPHONE HCL 2 MG/ML IJ SOLN: 1.0000 mg | INTRAMUSCULAR | Status: DC | PRN

## 2016-06-29 MED ORDER — SODIUM CHLORIDE 0.9 % IV SOLN
INTRAVENOUS | Status: DC
  Administered 2016-06-29: 75 mL/h via INTRAVENOUS
  Administered 2016-07-01 – 2016-07-02 (×2): via INTRAVENOUS

## 2016-06-29 MED ORDER — ONDANSETRON HCL 4 MG PO TABS: 4.0000 mg | ORAL_TABLET | Freq: Four times a day (QID) | ORAL | Status: DC | PRN

## 2016-06-29 MED ORDER — ONDANSETRON HCL 4 MG/2ML IJ SOLN
4.0000 mg | Freq: Four times a day (QID) | INTRAMUSCULAR | Status: DC | PRN
  Administered 2016-06-29: 4 mg via INTRAVENOUS
  Filled 2016-06-29 (×2): qty 2

## 2016-06-29 MED ORDER — FLEET ENEMA 7-19 GM/118ML RE ENEM: 1.0000 | ENEMA | Freq: Once | RECTAL | Status: DC | PRN

## 2016-06-29 MED ORDER — METHOCARBAMOL 1000 MG/10ML IJ SOLN
500.0000 mg | Freq: Four times a day (QID) | INTRAVENOUS | Status: DC | PRN
  Filled 2016-06-29: qty 5

## 2016-06-29 MED ORDER — ACETAMINOPHEN 650 MG RE SUPP: 650.0000 mg | Freq: Four times a day (QID) | RECTAL | Status: DC | PRN

## 2016-06-29 MED ORDER — ACETAMINOPHEN 325 MG PO TABS: 650.0000 mg | ORAL_TABLET | Freq: Four times a day (QID) | ORAL | Status: DC | PRN

## 2016-06-29 MED ORDER — DOCUSATE SODIUM 100 MG PO CAPS
100.0000 mg | ORAL_CAPSULE | Freq: Two times a day (BID) | ORAL | Status: DC
  Administered 2016-06-29 – 2016-07-03 (×7): 100 mg via ORAL
  Filled 2016-06-29 (×7): qty 1

## 2016-06-29 MED ORDER — ENOXAPARIN SODIUM 40 MG/0.4ML ~~LOC~~ SOLN: 40.0000 mg | SUBCUTANEOUS | Status: DC

## 2016-06-29 NOTE — ED Notes (Signed)
Made Dr Criss AlvineGoldston aware of patient has fracture, just location unknown at this time.  Will wait for results
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
## 2016-06-29 NOTE — ED Notes (Signed)
Bed: [facility] Expected date:  Expected time:  Means of arrival:  Comments: 

## 2016-06-29 NOTE — ED Notes (Signed)
CBC already resulted at 19:10, verified with MD

## 2016-06-29 NOTE — ED Triage Notes (Signed)
Per EMS, pt complains of right knee pain after fall. Pt has hx of knee surgery. Pt denies loss of consciousness.

## 2016-06-29 NOTE — ED Notes (Signed)
Per radiology patient has "pretty bad fracture".  Patient moved to rm 25 from hallway.

## 2016-06-29 NOTE — Progress Notes (Signed)
Orthopedic Tech Progress Note Patient Details:  Francisca DecemberMarilyn G Blaydes 08-02-1950 [id number]  Musculoskeletal Traction Type of Traction: Bucks Skin Traction Traction Location: Applied Bucks Traction to Right leg with 10 pounds.  Traction Weight: 10 lbs    Alvina ChouWilliams, Kiptyn Rafuse C 06/29/2016, 10:35 PM

## 2016-06-30 LAB — SURGICAL PCR SCREEN
MRSA, PCR: NEGATIVE
STAPHYLOCOCCUS AUREUS: NEGATIVE

## 2016-06-30 MED ORDER — ACETAMINOPHEN 500 MG PO TABS: 1000.0000 mg | ORAL_TABLET | Freq: Once | ORAL | Status: DC

## 2016-06-30 MED ORDER — CEFAZOLIN SODIUM-DEXTROSE 2-4 GM/100ML-% IV SOLN
2.0000 g | INTRAVENOUS | Status: AC
  Filled 2016-06-30: qty 100

## 2016-06-30 MED ORDER — POVIDONE-IODINE 10 % EX SWAB: 2.0000 "application " | Freq: Once | CUTANEOUS | Status: DC

## 2016-06-30 MED ORDER — CHLORHEXIDINE GLUCONATE 4 % EX LIQD
60.0000 mL | Freq: Once | CUTANEOUS | Status: AC
  Administered 2016-07-01: 4 via TOPICAL
  Filled 2016-06-30: qty 15

## 2016-06-30 MED ORDER — SODIUM CHLORIDE 0.9 % IV SOLN: INTRAVENOUS | Status: DC

## 2016-06-30 MED ORDER — ENOXAPARIN SODIUM 40 MG/0.4ML ~~LOC~~ SOLN
40.0000 mg | SUBCUTANEOUS | Status: AC
Start: 2016-06-30 — End: 2016-06-30
  Administered 2016-06-30: 40 mg via SUBCUTANEOUS
  Filled 2016-06-30: qty 0.4

## 2016-06-30 NOTE — Progress Notes (Signed)
SPORTS MEDICINE AND JOINT REPLACEMENT  Sherry SpurlingStephen Lucey, MD    Laurier Nancyolby Robbins, PA-C [address], [place], [place]  [id number]                             [phone]   PROGRESS NOTE  Subjective:  negative for Chest Pain  negative for Shortness of Breath  negative for Nausea/Vomiting   negative for Calf Pain  negative for Bowel Movement   Tolerating Diet: yes         Patient reports pain as 6 on 0-10 scale.    Objective: Vital signs in last 24 hours:   Patient Vitals for the past 24 hrs:  BP Temp Temp src Pulse Resp SpO2  06/30/16 0541 132/69 98.1 F (36.7 C) Oral 92 17 96 %  06/30/16 0022 100/60 99.2 F (37.3 C) Oral 81 16 93 %  06/29/16 2155 (!) 147/73 97.5 F (36.4 C) Oral 61 15 93 %  06/29/16 2100 129/81 - - 64 - -  06/29/16 2030 117/78 - - 66 - -  06/29/16 2000 143/84 - - 74 - -  06/29/16 1930 130/75 - - 68 - -  06/29/16 1912 126/73 - - 72 15 95 %  06/29/16 1900 126/73 - - 78 - -  06/29/16 1830 131/89 - - 78 - -  06/29/16 1621 (!) 178/133 97.6 F (36.4 C) Oral 81 18 100 %    @flow {1959:LAST@   Intake/Output from previous day:   12/08 0701 - 12/09 0700 In: 230 [I.V.:230] Out: -    Intake/Output this shift:   No intake/output data recorded.   Intake/Output      12/08 0701 - 12/09 0700 12/09 0701 - 12/10 0700   I.V. 230    Total Intake 230     Net +230          Urine Occurrence 1 x       LABORATORY DATA:  Recent Labs  06/29/16 1840  WBC 9.1  HGB 12.5  HCT 39.7  PLT 299    Recent Labs  06/29/16 1840  NA 139  K 3.9  CL 104  CO2 27  BUN 26*  CREATININE 0.73  GLUCOSE 131*  CALCIUM 9.1   Lab Results  Component Value Date   INR 1.00 02/13/2016   INR 1.01 07/29/2014   INR 0.99 01/14/2014    Examination:  General appearance: alert, cooperative and no distress Extremities: right lower extremity swelling, edema. all other extremeties normal   Motor Exam: Quadriceps and Hamstrings Intact  Sensory Exam: Superficial Peroneal,  Deep Peroneal and Tibial normal   Assessment:       Procedure(s) (LRB): INTRAMEDULLARY (IM) RETROGRADE FEMORAL NAILING (Right)  ADDITIONAL DIAGNOSIS:  Active Problems:   Closed femur fracture (HCC)    Plan: Physical Therapy as ordered Non Weight Bearing (NWB)  DVT Prophylaxis:  Lovenox  DISCHARGE PLAN: Home  Patient doing well and in 10 pounds of bucks traction. Will take to OR tomorrow morning for right IM femur nail and follow postoperatively         Guy SandiferColby Alan Robbins 06/30/2016, 8:31 AM
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
## 2016-07-01 ENCOUNTER — Inpatient Hospital Stay (HOSPITAL_COMMUNITY): Payer: BC Managed Care – PPO | Admitting: Anesthesiology

## 2016-07-01 ENCOUNTER — Inpatient Hospital Stay (HOSPITAL_COMMUNITY): Payer: BC Managed Care – PPO

## 2016-07-01 ENCOUNTER — Encounter (HOSPITAL_COMMUNITY)
Admission: EM | Disposition: A | Discharge status: Home / Self Care | Payer: Self-pay | Source: Home / Self Care | Attending: Orthopedic Surgery

## 2016-07-01 HISTORY — PX: FEMUR IM NAIL: SHX1597

## 2016-07-01 SURGERY — INSERTION, INTRAMEDULLARY ROD, FEMUR, RETROGRADE
Anesthesia: General | Site: Leg Upper | Laterality: Right

## 2016-07-01 MED ORDER — ONDANSETRON HCL 4 MG/2ML IJ SOLN
INTRAMUSCULAR | Status: AC
  Filled 2016-07-01: qty 2

## 2016-07-01 MED ORDER — LIDOCAINE 2% (20 MG/ML) 5 ML SYRINGE
INTRAMUSCULAR | Status: AC
  Filled 2016-07-01: qty 5

## 2016-07-01 MED ORDER — MIDAZOLAM HCL 2 MG/2ML IJ SOLN
INTRAMUSCULAR | Status: DC | PRN
  Administered 2016-07-01: 2 mg via INTRAVENOUS

## 2016-07-01 MED ORDER — DEXAMETHASONE SODIUM PHOSPHATE 10 MG/ML IJ SOLN
INTRAMUSCULAR | Status: AC
  Filled 2016-07-01: qty 1

## 2016-07-01 MED ORDER — CEFAZOLIN SODIUM-DEXTROSE 2-3 GM-% IV SOLR
INTRAVENOUS | Status: DC | PRN
  Administered 2016-07-01: 2 g via INTRAVENOUS

## 2016-07-01 MED ORDER — ENOXAPARIN SODIUM 30 MG/0.3ML ~~LOC~~ SOLN
30.0000 mg | SUBCUTANEOUS | Status: DC
  Administered 2016-07-02 – 2016-07-03 (×2): 30 mg via SUBCUTANEOUS
  Filled 2016-07-01 (×2): qty 0.3

## 2016-07-01 MED ORDER — SUGAMMADEX SODIUM 200 MG/2ML IV SOLN
INTRAVENOUS | Status: AC
  Filled 2016-07-01: qty 2

## 2016-07-01 MED ORDER — ROCURONIUM BROMIDE 50 MG/5ML IV SOSY
PREFILLED_SYRINGE | INTRAVENOUS | Status: DC | PRN
  Administered 2016-07-01: 50 mg via INTRAVENOUS

## 2016-07-01 MED ORDER — CEFAZOLIN SODIUM-DEXTROSE 2-4 GM/100ML-% IV SOLN
INTRAVENOUS | Status: AC
  Filled 2016-07-01: qty 100

## 2016-07-01 MED ORDER — PROPOFOL 10 MG/ML IV BOLUS
INTRAVENOUS | Status: AC
  Filled 2016-07-01: qty 20

## 2016-07-01 MED ORDER — DEXTROSE 50 % IV SOLN
INTRAVENOUS | Status: AC
  Filled 2016-07-01: qty 50

## 2016-07-01 MED ORDER — LIDOCAINE 2% (20 MG/ML) 5 ML SYRINGE
INTRAMUSCULAR | Status: DC | PRN
  Administered 2016-07-01: 60 mg via INTRAVENOUS

## 2016-07-01 MED ORDER — PROMETHAZINE HCL 25 MG/ML IJ SOLN: 6.2500 mg | INTRAMUSCULAR | Status: DC | PRN

## 2016-07-01 MED ORDER — MIDAZOLAM HCL 2 MG/2ML IJ SOLN
INTRAMUSCULAR | Status: AC
  Filled 2016-07-01: qty 2

## 2016-07-01 MED ORDER — 0.9 % SODIUM CHLORIDE (POUR BTL) OPTIME
TOPICAL | Status: DC | PRN
  Administered 2016-07-01: 1000 mL

## 2016-07-01 MED ORDER — FENTANYL CITRATE (PF) 100 MCG/2ML IJ SOLN
INTRAMUSCULAR | Status: AC
  Filled 2016-07-01: qty 2

## 2016-07-01 MED ORDER — PROPOFOL 10 MG/ML IV BOLUS
INTRAVENOUS | Status: DC | PRN
  Administered 2016-07-01: 150 mg via INTRAVENOUS
  Administered 2016-07-01: 50 mg via INTRAVENOUS

## 2016-07-01 MED ORDER — DEXAMETHASONE SODIUM PHOSPHATE 10 MG/ML IJ SOLN
INTRAMUSCULAR | Status: DC | PRN
  Administered 2016-07-01: 10 mg via INTRAVENOUS

## 2016-07-01 MED ORDER — ROCURONIUM BROMIDE 50 MG/5ML IV SOSY
PREFILLED_SYRINGE | INTRAVENOUS | Status: AC
  Filled 2016-07-01: qty 5

## 2016-07-01 MED ORDER — FENTANYL CITRATE (PF) 100 MCG/2ML IJ SOLN
INTRAMUSCULAR | Status: DC | PRN
  Administered 2016-07-01 (×4): 100 ug via INTRAVENOUS

## 2016-07-01 MED ORDER — ONDANSETRON HCL 4 MG/2ML IJ SOLN
INTRAMUSCULAR | Status: DC | PRN
  Administered 2016-07-01: 4 mg via INTRAVENOUS

## 2016-07-01 MED ORDER — BUPIVACAINE-EPINEPHRINE (PF) 0.25% -1:200000 IJ SOLN
INTRAMUSCULAR | Status: AC
  Filled 2016-07-01: qty 30

## 2016-07-01 MED ORDER — SUGAMMADEX SODIUM 200 MG/2ML IV SOLN
INTRAVENOUS | Status: DC | PRN
  Administered 2016-07-01: 200 mg via INTRAVENOUS

## 2016-07-01 MED ORDER — SCOPOLAMINE 1 MG/3DAYS TD PT72
MEDICATED_PATCH | TRANSDERMAL | Status: AC
  Filled 2016-07-01: qty 1

## 2016-07-01 MED ORDER — HYDROMORPHONE HCL 1 MG/ML IJ SOLN: 0.2500 mg | INTRAMUSCULAR | Status: DC | PRN

## 2016-07-01 MED ORDER — SCOPOLAMINE 1 MG/3DAYS TD PT72
MEDICATED_PATCH | TRANSDERMAL | Status: DC | PRN
  Administered 2016-07-01: 1 via TRANSDERMAL

## 2016-07-01 MED ORDER — LACTATED RINGERS IV SOLN
INTRAVENOUS | Status: DC | PRN
  Administered 2016-07-01 (×2): via INTRAVENOUS

## 2016-07-01 SURGICAL SUPPLY — 70 items
BANDAGE ACE 4X5 VEL STRL LF (GAUZE/BANDAGES/DRESSINGS) ×3 IMPLANT
BANDAGE ACE 6X5 VEL STRL LF (GAUZE/BANDAGES/DRESSINGS) ×3 IMPLANT
BANDAGE ELASTIC 4 LF NS (GAUZE/BANDAGES/DRESSINGS) ×3 IMPLANT
BANDAGE ELASTIC 6 VELCRO ST LF (GAUZE/BANDAGES/DRESSINGS) ×3 IMPLANT
BANDAGE ESMARK 6X9 LF (GAUZE/BANDAGES/DRESSINGS) IMPLANT
BIT DRILL CALIBRATED 4.3MMX365 (DRILL) ×1 IMPLANT
BIT DRILL CROWE PNT TWST 4.5MM (DRILL) ×1 IMPLANT
BLADE SURG 15 STRL LF DISP TIS (BLADE) ×1 IMPLANT
BLADE SURG 15 STRL SS (BLADE) ×2
BLADE SURG ROTATE 9660 (MISCELLANEOUS) IMPLANT
BNDG COHESIVE 6X5 TAN STRL LF (GAUZE/BANDAGES/DRESSINGS) ×3 IMPLANT
BNDG ESMARK 6X9 LF (GAUZE/BANDAGES/DRESSINGS)
BNDG GAUZE ELAST 4 BULKY (GAUZE/BANDAGES/DRESSINGS) ×3 IMPLANT
CUFF TOURNIQUET SINGLE 34IN LL (TOURNIQUET CUFF) IMPLANT
CUFF TOURNIQUET SINGLE 44IN (TOURNIQUET CUFF) IMPLANT
DRAPE C-ARM 42X72 X-RAY (DRAPES) ×3 IMPLANT
DRAPE IMP U-DRAPE 54X76 (DRAPES) ×3 IMPLANT
DRAPE ORTHO SPLIT 77X108 STRL (DRAPES) ×4
DRAPE PROXIMA HALF (DRAPES) ×6 IMPLANT
DRAPE SURG ORHT 6 SPLT 77X108 (DRAPES) ×2 IMPLANT
DRAPE U-SHAPE 47X51 STRL (DRAPES) ×3 IMPLANT
DRESSING ADAPTIC 1/2  N-ADH (PACKING) ×3 IMPLANT
DRILL CALIBRATED 4.3MMX365 (DRILL) ×3
DRILL CROWE POINT TWIST 4.5MM (DRILL) ×3
DRSG ADAPTIC 3X8 NADH LF (GAUZE/BANDAGES/DRESSINGS) ×3 IMPLANT
DRSG MEPILEX BORDER 4X4 (GAUZE/BANDAGES/DRESSINGS) ×3 IMPLANT
DURAPREP 26ML APPLICATOR (WOUND CARE) ×3 IMPLANT
ELECT REM PT RETURN 9FT ADLT (ELECTROSURGICAL) ×3
ELECTRODE REM PT RTRN 9FT ADLT (ELECTROSURGICAL) ×1 IMPLANT
GAUZE SPONGE 4X4 12PLY STRL (GAUZE/BANDAGES/DRESSINGS) ×6 IMPLANT
GLOVE BIOGEL PI IND STRL 7.5 (GLOVE) ×1 IMPLANT
GLOVE BIOGEL PI IND STRL 8.5 (GLOVE) ×5 IMPLANT
GLOVE BIOGEL PI INDICATOR 7.5 (GLOVE) ×2
GLOVE BIOGEL PI INDICATOR 8.5 (GLOVE) ×10
GLOVE SURG ORTHO 7.0 STRL STRW (GLOVE) ×3 IMPLANT
GLOVE SURG ORTHO 8.0 STRL STRW (GLOVE) ×18 IMPLANT
GOWN STRL REUS W/ TWL LRG LVL3 (GOWN DISPOSABLE) ×2 IMPLANT
GOWN STRL REUS W/ TWL XL LVL3 (GOWN DISPOSABLE) ×1 IMPLANT
GOWN STRL REUS W/TWL 2XL LVL3 (GOWN DISPOSABLE) ×3 IMPLANT
GOWN STRL REUS W/TWL LRG LVL3 (GOWN DISPOSABLE) ×4
GOWN STRL REUS W/TWL XL LVL3 (GOWN DISPOSABLE) ×2
GUIDEPIN 3.2X17.5 THRD DISP (PIN) ×3 IMPLANT
GUIDEWIRE BEAD TIP (WIRE) ×3 IMPLANT
KIT BASIN OR (CUSTOM PROCEDURE TRAY) ×3 IMPLANT
KIT ROOM TURNOVER OR (KITS) ×3 IMPLANT
MANIFOLD NEPTUNE II (INSTRUMENTS) ×3 IMPLANT
NAIL FEM RETRO 10.5X340 (Nail) ×3 IMPLANT
NEEDLE 22X1 1/2 (OR ONLY) (NEEDLE) ×3 IMPLANT
NS IRRIG 1000ML POUR BTL (IV SOLUTION) ×3 IMPLANT
PACK GENERAL/GYN (CUSTOM PROCEDURE TRAY) ×3 IMPLANT
PACK UNIVERSAL I (CUSTOM PROCEDURE TRAY) ×3 IMPLANT
PAD ARMBOARD 7.5X6 YLW CONV (MISCELLANEOUS) ×6 IMPLANT
PADDING CAST ABS 6INX4YD NS (CAST SUPPLIES) ×2
PADDING CAST ABS COTTON 6X4 NS (CAST SUPPLIES) ×1 IMPLANT
PADDING CAST SYNTHETIC 2 (CAST SUPPLIES) ×2
PADDING CAST SYNTHETIC 2X4 NS (CAST SUPPLIES) ×1 IMPLANT
SCREW CORT TI DBL LEAD 5X32 (Screw) ×3 IMPLANT
SCREW CORT TI DBL LEAD 5X56 (Screw) ×3 IMPLANT
SCREW CORT TI DBL LEAD 5X65 (Screw) ×3 IMPLANT
SCREW CORT TI DBL LEAD 5X75 (Screw) ×3 IMPLANT
SCREW CORT TI DBLE LEAD 5X52 (Screw) ×3 IMPLANT
SPONGE GAUZE 4X4 12PLY STER LF (GAUZE/BANDAGES/DRESSINGS) ×3 IMPLANT
STAPLER VISISTAT 35W (STAPLE) ×3 IMPLANT
STOCKINETTE IMPERVIOUS LG (DRAPES) ×3 IMPLANT
SUT VIC AB 0 CTB1 27 (SUTURE) ×3 IMPLANT
SUT VIC AB 2-0 CTB1 (SUTURE) ×3 IMPLANT
SYR CONTROL 10ML LL (SYRINGE) ×3 IMPLANT
TOWEL OR 17X24 6PK STRL BLUE (TOWEL DISPOSABLE) ×3 IMPLANT
TOWEL OR 17X26 10 PK STRL BLUE (TOWEL DISPOSABLE) ×3 IMPLANT
WATER STERILE IRR 1000ML POUR (IV SOLUTION) ×3 IMPLANT

## 2016-07-01 NOTE — Anesthesia Procedure Notes (Signed)
Procedure Name: Intubation Date/Time: 07/01/2016 7:52 AM Performed by: Alanda AmassFRIEDMAN, Darling Cieslewicz A Pre-anesthesia Checklist: Patient identified, Emergency Drugs available, Suction available, Patient being monitored and Timeout performed Patient Re-evaluated:Patient Re-evaluated prior to inductionOxygen Delivery Method: Circle System Utilized and Circle system utilized Preoxygenation: Pre-oxygenation with 100% oxygen Intubation Type: IV induction Ventilation: Mask ventilation without difficulty Laryngoscope Size: Glidescope Tube type: Oral Tube size: 7.5 mm Number of attempts: 1 Airway Equipment and Method: Stylet and Oral airway Placement Confirmation: ETT inserted through vocal cords under direct vision,  positive ETCO2 and breath sounds checked- equal and bilateral Secured at: 21 cm Tube secured with: Tape Dental Injury: Teeth and Oropharynx as per pre-operative assessment

## 2016-07-01 NOTE — Anesthesia Preprocedure Evaluation (Signed)
Anesthesia Evaluation  Patient identified by MRN, date of birth, ID band Patient awake    Reviewed: Allergy & Precautions, NPO status , Patient's Chart, lab work & pertinent test results  Airway Mallampati: II  TM Distance: >3 FB Neck ROM: Full    Dental no notable dental hx.    Pulmonary neg pulmonary ROS, former smoker,    Pulmonary exam normal breath sounds clear to auscultation       Cardiovascular hypertension, Normal cardiovascular exam Rhythm:Regular Rate:Normal     Neuro/Psych negative neurological ROS  negative psych ROS   GI/Hepatic negative GI ROS, Neg liver ROS,   Endo/Other  negative endocrine ROS  Renal/GU negative Renal ROS  negative genitourinary   Musculoskeletal negative musculoskeletal ROS (+)   Abdominal   Peds negative pediatric ROS (+)  Hematology negative hematology ROS (+)   Anesthesia Other Findings   Reproductive/Obstetrics negative OB ROS                             Anesthesia Physical Anesthesia Plan  ASA: II  Anesthesia Plan: General   Post-op Pain Management:    Induction: Intravenous  Airway Management Planned: Oral ETT  Additional Equipment:   Intra-op Plan:   Post-operative Plan: Extubation in OR  Informed Consent: I have reviewed the patients History and Physical, chart, labs and discussed the procedure including the risks, benefits and alternatives for the proposed anesthesia with the patient or authorized representative who has indicated his/her understanding and acceptance.   Dental advisory given  Plan Discussed with: CRNA and Surgeon  Anesthesia Plan Comments:         Anesthesia Quick Evaluation  

## 2016-07-01 NOTE — Anesthesia Postprocedure Evaluation (Signed)
Anesthesia Post Note  Patient: Sherry Gutierrez  Procedure(s) Performed: Procedure(s) (LRB): INTRAMEDULLARY (IM) RETROGRADE FEMORAL NAILING (Right)  Patient location during evaluation: PACU Anesthesia Type: General Level of consciousness: awake and alert Pain management: pain level controlled Vital Signs Assessment: post-procedure vital signs reviewed and stable Respiratory status: spontaneous breathing, nonlabored ventilation, respiratory function stable and patient connected to nasal cannula oxygen Cardiovascular status: blood pressure returned to baseline and stable Postop Assessment: no signs of nausea or vomiting Anesthetic complications: no    Last Vitals:  Vitals:   07/01/16 1000 07/01/16 1015  BP: (!) 153/82 (!) 146/80  Pulse: 88 79  Resp: 14 20  Temp:      Last Pain:  Vitals:   07/01/16 0945  TempSrc:   PainSc: Asleep        RLE Motor Response: Purposeful movement;Responds to commands (07/01/16 1015) RLE Sensation: Full sensation;No numbness;No pain (07/01/16 1015)      Suhayla Chisom S

## 2016-07-01 NOTE — Transfer of Care (Signed)
Immediate Anesthesia Transfer of Care Note  Patient: Sherry Gutierrez  Procedure(s) Performed: Procedure(s): INTRAMEDULLARY (IM) RETROGRADE FEMORAL NAILING (Right)  Patient Location: PACU  Anesthesia Type:General  Level of Consciousness: awake  Airway & Oxygen Therapy: Patient Spontanous Breathing and Patient connected to nasal cannula oxygen  Post-op Assessment: Report given to RN and Post -op Vital signs reviewed and stable  Post vital signs: Reviewed and stable  Last Vitals:  Vitals:   07/01/16 0542 07/01/16 0930  BP: 108/83   Pulse: 85   Resp: 16   Temp: 36.9 C 36.4 C    Last Pain:  Vitals:   07/01/16 0542  TempSrc: Oral  PainSc:       Patients Stated Pain Goal: 3 (07/01/16 16100213)  Complications: No apparent anesthesia complications

## 2016-07-02 ENCOUNTER — Encounter (HOSPITAL_COMMUNITY): Payer: Self-pay | Admitting: Orthopedic Surgery

## 2016-07-02 MED ORDER — OXYCODONE HCL 5 MG PO TABS: 5.0000 mg | ORAL_TABLET | ORAL | 0 refills | Status: DC | PRN

## 2016-07-02 MED ORDER — ASPIRIN EC 325 MG PO TBEC: 325.0000 mg | DELAYED_RELEASE_TABLET | Freq: Two times a day (BID) | ORAL | 0 refills | Status: DC

## 2016-07-02 MED ORDER — METHOCARBAMOL 500 MG PO TABS: 500.0000 mg | ORAL_TABLET | Freq: Four times a day (QID) | ORAL | 0 refills | Status: DC | PRN

## 2016-07-02 MED ORDER — WHITE PETROLATUM GEL
Status: AC
  Administered 2016-07-02: 10:00:00
  Filled 2016-07-02: qty 1

## 2016-07-02 NOTE — Progress Notes (Signed)
Orthopedic Tech Progress Note Patient Details:  Sherry Gutierrez April 25, 1951 [id number]  Ortho Devices Type of Ortho Device: Crutches Ortho Device/Splint Interventions: Marisa SprinklesOrdered   Ica Daye C Remmy Riffe 07/02/2016, 11:10 AM

## 2016-07-02 NOTE — Progress Notes (Signed)
SPORTS MEDICINE AND JOINT REPLACEMENT  Georgena SpurlingStephen Lucey, MD    Laurier Nancyolby Robbins, PA-C [address], [place], [place]  [id number]                             [phone]   PROGRESS NOTE  Subjective:  negative for Chest Pain  negative for Shortness of Breath  negative for Nausea/Vomiting   negative for Calf Pain  negative for Bowel Movement   Tolerating Diet: yes         Patient reports pain as 4 on 0-10 scale.    Objective: Vital signs in last 24 hours:   Patient Vitals for the past 24 hrs:  BP Temp Temp src Pulse Resp SpO2  07/02/16 1347 (!) 143/79 98.1 F (36.7 C) Oral 86 - 100 %  07/02/16 0510 135/78 97.9 F (36.6 C) Oral 79 16 100 %  07/01/16 2045 125/71 98.8 F (37.1 C) Oral 90 16 97 %    @flow {1959:LAST@   Intake/Output from previous day:   12/10 0701 - 12/11 0700 In: 1400 [I.V.:1400] Out: 300    Intake/Output this shift:   No intake/output data recorded.   Intake/Output      12/11 0701 - 12/12 0700   I.V. (mL/kg)    Total Intake(mL/kg)    Urine (mL/kg/hr)    Blood    Total Output     Net            LABORATORY DATA:  Recent Labs  06/29/16 1840  WBC 9.1  HGB 12.5  HCT 39.7  PLT 299    Recent Labs  06/29/16 1840  NA 139  K 3.9  CL 104  CO2 27  BUN 26*  CREATININE 0.73  GLUCOSE 131*  CALCIUM 9.1   Lab Results  Component Value Date   INR 1.00 02/13/2016   INR 1.01 07/29/2014   INR 0.99 01/14/2014    Examination:  General appearance: alert, cooperative and no distress Extremities: extremities normal, atraumatic, no cyanosis or edema  Wound Exam: clean, dry, intact   Drainage:  None: wound tissue dry  Motor Exam: Quadriceps and Hamstrings Intact  Sensory Exam: Superficial Peroneal, Deep Peroneal and Tibial normal   Assessment:    1 Day Post-Op  Procedure(s) (LRB): INTRAMEDULLARY (IM) RETROGRADE FEMORAL NAILING (Right)  ADDITIONAL DIAGNOSIS:  Active Problems:   Closed femur fracture (HCC)     Plan: Physical  Therapy as ordered Non Weight Bearing (NWB)  DVT Prophylaxis:  Lovenox  DISCHARGE PLAN: Home  DISCHARGE NEEDS: HHPT   Patient doing well, will D/C her home tomorrow morning and will follow up with her in the office in 2 weeks.          Guy SandiferColby Alan Robbins 07/02/2016, 7:10 PM
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
## 2016-07-02 NOTE — Discharge Summary (Signed)
SPORTS MEDICINE & JOINT REPLACEMENT   Georgena SpurlingStephen Lucey, MD   Laurier Nancyolby Ketina Mars, PA-C [address], [place], [place]  [address]                             [phone]  PATIENT ID: Francisca DecemberMarilyn G Gergen        MRN:  [id number]          DOB/AGE: 02/17/1951 / 65 y.o.    DISCHARGE SUMMARY  ADMISSION DATE:    06/29/2016 DISCHARGE DATE:   07/02/2016   ADMISSION DIAGNOSIS: Fall [W19.XXXA] Closed fracture of distal end of right femur, unspecified fracture morphology, initial encounter (HCC) [S72.401A]    DISCHARGE DIAGNOSIS:  right femur fracture    ADDITIONAL DIAGNOSIS: Active Problems:   Closed femur fracture (HCC)  Past Medical History:  Diagnosis Date  . Arthritis    OA LEFT KNEE  . Hypertension     PROCEDURE: Procedure(s): INTRAMEDULLARY (IM) RETROGRADE FEMORAL NAILING on 06/29/2016 - 07/01/2016  CONSULTS:    HISTORY:  See H&P in chart  HOSPITAL COURSE:  Francisca DecemberMarilyn G Dross is a 65 y.o. admitted on 06/29/2016 and found to have a diagnosis of right femur fracture.  After appropriate laboratory studies were obtained  they were taken to the operating room on 06/29/2016 - 07/01/2016 and underwent Procedure(s): INTRAMEDULLARY (IM) RETROGRADE FEMORAL NAILING.   They were given perioperative antibiotics:  Anti-infectives    Start     Dose/Rate Route Frequency Ordered Stop   07/01/16 0725  ceFAZolin (ANCEF) 2-4 GM/100ML-% IVPB    Comments:  Alanda AmassFriedman, Scot   : cabinet override      07/01/16 0725 07/01/16 1929   06/30/16 1130  ceFAZolin (ANCEF) IVPB 2g/100 mL premix     2 g 200 mL/hr over 30 Minutes Intravenous On call to O.R. 06/30/16 1125 07/01/16 0559    .  Patient given tranexamic acid IV or topical and exparel intra-operatively.  Tolerated the procedure well.    POD# 1: Vital signs were stable.  Patient denied Chest pain, shortness of breath, or calf pain.  Patient was started on Lovenox 30 mg subcutaneously twice daily at 8am.  Consults to PT, OT, and care management were made.   The patient was weight bearing as tolerated.  CPM was placed on the operative leg 0-90 degrees for 6-8 hours a day. When out of the CPM, patient was placed in the foam block to achieve full extension. Incentive spirometry was taught.  Dressing was changed.       POD #2, Continued  PT for ambulation and exercise program.  IV saline locked.  O2 discontinued.    The remainder of the hospital course was dedicated to ambulation and strengthening.   The patient was discharged on 1 Day Post-Op in  Good condition.  Blood products given:none  DIAGNOSTIC STUDIES: Recent vital signs: Patient Vitals for the past 24 hrs:  BP Temp Temp src Pulse Resp SpO2  07/02/16 1347 (!) 143/79 98.1 F (36.7 C) Oral 86 - 100 %  07/02/16 0510 135/78 97.9 F (36.6 C) Oral 79 16 100 %  07/01/16 2045 125/71 98.8 F (37.1 C) Oral 90 16 97 %       Recent laboratory studies:  Recent Labs  06/29/16 1840  WBC 9.1  HGB 12.5  HCT 39.7  PLT 299    Recent Labs  06/29/16 1840  NA 139  K 3.9  CL 104  CO2 27  BUN  26*  CREATININE 0.73  GLUCOSE 131*  CALCIUM 9.1   Lab Results  Component Value Date   INR 1.00 02/13/2016   INR 1.01 07/29/2014   INR 0.99 01/14/2014     Recent Radiographic Studies :  Dg Pelvis 1-2 Views  Result Date: 06/29/2016 CLINICAL DATA:  Fall, right leg pain EXAM: PELVIS - 1-2 VIEW COMPARISON:  None. FINDINGS: Visualized bony pelvis appears intact. Bilateral hip joint spaces are preserved. Mild degenerative changes of the lower lumbar spine. IMPRESSION: Negative. Electronically Signed   By: Charline Bills M.D.   On: 06/29/2016 18:30   Dg Knee 1-2 Views Right  Result Date: 06/29/2016 CLINICAL DATA:  Right leg pain after fall EXAM: RIGHT KNEE - 1-2 VIEW COMPARISON:  08/25/2014 FINDINGS: There is an acute, closed, comminuted fracture of the distal femur at the junction of the middle and distal third. No intra-articular extension of the fracture is apparent. There is dorsal and lateral  displacement of the distal fracture fragment by 1 shaft width dorsally and 1/2 shaft width laterally. Slight dorsal angulation of the distal fracture fragment relative to the proximal fragment. Lateral plate and screw fixation of the proximal tibia is again noted with intact appearing hardware. There is osteoarthritis of the knee joint. IMPRESSION: There is an acute, closed, comminuted fracture of the distal femur at the junction of the middle and distal third. No intra-articular extension of the fracture is apparent. There is dorsal and lateral displacement of the distal fracture fragment by 1 shaft width dorsally and 1/2 shaft width laterally. Electronically Signed   By: Tollie Eth M.D.   On: 06/29/2016 18:32   Dg Tibia/fibula Right  Result Date: 06/29/2016 CLINICAL DATA:  Right leg pain after fall.  Initial encounter. EXAM: RIGHT TIBIA AND FIBULA - 2 VIEW COMPARISON:  08/25/2014 FINDINGS: Partly seen distal femur fracture, described separately. Previous bimalleolar and syndesmotic repair with prominent posttraumatic osteoarthritis of the tibiotalar joint. Previous tibial plateau fracture with ORIF that appears healed. Limited visualization due to positioning. Knee osteoarthritis. IMPRESSION: 1. Distal femur fracture is incompletely seen and described separately. 2. Previous tibial plateau and ankle fracture status post ORIF. Limited positioning of the tibia and fibula without suspected acute abnormality. 3. 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 Electronically Signed   By: Marnee Spring M.D.   On: 06/29/2016 18:29   Dg Foot Complete Right  Result Date: 06/29/2016 CLINICAL DATA:  Pain after fall. EXAM: RIGHT FOOT COMPLETE - 3+ VIEW COMPARISON:  None. FINDINGS: There is a hallux valgus deformity with significant degenerative changes at the first MTP joint. Screws and plates associated with the distal fibula and tibia consistent with previous surgery. No acute fractures within the foot.  IMPRESSION: No acute fractures within the foot. Electronically Signed   By: Gerome Sam III M.D   On: 06/29/2016 18:31   Dg C-arm 1-60 Min  Result Date: 07/01/2016 CLINICAL DATA:  Fracture fixation. EXAM: DG C-ARM 61-120 MIN; RIGHT FEMUR 2 VIEWS COMPARISON:  06/29/2016 FINDINGS: There has been placement of a right femoral intramedullary nail bridging patient's distal femoral fracture with hardware intact and anatomic alignment over the fracture site. There are degenerative changes of the knee. Existing hardware unchanged over the proximal tibia. IMPRESSION: Fixation of distal femoral fracture with hardware intact and anatomic alignment over the fracture site. Electronically Signed   By: Elberta Fortis M.D.   On: 07/01/2016 10:02   Dg Femur, Min 2 Views Right  Result Date: 07/01/2016 CLINICAL DATA:  Fracture fixation.  EXAM: DG C-ARM 61-120 MIN; RIGHT FEMUR 2 VIEWS COMPARISON:  06/29/2016 FINDINGS: There has been placement of a right femoral intramedullary nail bridging patient's distal femoral fracture with hardware intact and anatomic alignment over the fracture site. There are degenerative changes of the knee. Existing hardware unchanged over the proximal tibia. IMPRESSION: Fixation of distal femoral fracture with hardware intact and anatomic alignment over the fracture site. Electronically Signed   By: Elberta Fortisaniel  Boyle M.D.   On: 07/01/2016 10:02   Dg Femur Min 2 Views Right  Result Date: 06/29/2016 CLINICAL DATA:  Fall, right leg pain EXAM: RIGHT FEMUR 2 VIEWS COMPARISON:  None. FINDINGS: Comminuted fracture of the distal femoral shaft with posterior displacement of the distal fracture fragment. Fracture extends inferiorly into the femoral condyles. Intra-articular extension is difficult to exclude, as evaluation is limited by obliquity/difficulty with patient positioning. Status post ORIF of the proximal tibia related to prior fracture. IMPRESSION: Comminuted fracture of the distal femoral shaft,  with associated posterior displacement, as above. Electronically Signed   By: Charline BillsSriyesh  Krishnan M.D.   On: 06/29/2016 18:32    DISCHARGE INSTRUCTIONS: Discharge Instructions    Call MD / Call 911    Complete by:  As directed    If you experience chest pain or shortness of breath, CALL 911 and be transported to the hospital emergency room.  If you develope a fever above 101 F, pus (white drainage) or increased drainage or redness at the wound, or calf pain, call your surgeon's office.   Constipation Prevention    Complete by:  As directed    Drink plenty of fluids.  Prune juice may be helpful.  You may use a stool softener, such as Colace (over the counter) 100 mg twice a day.  Use MiraLax (over the counter) for constipation as needed.   Diet - low sodium heart healthy    Complete by:  As directed    Discharge instructions    Complete by:  As directed    Keep wound clean/dry  Remain non weight bearing  Follow up in the office in 2 weeks for xrays and to start outpatient PT   Increase activity slowly as tolerated    Complete by:  As directed       DISCHARGE MEDICATIONS:     Medication List    STOP taking these medications   meloxicam 15 MG tablet Commonly known as:  MOBIC     TAKE these medications   acetaminophen 325 MG tablet Commonly known as:  TYLENOL Take 1-2 tablets (325-650 mg total) by mouth every 4 (four) hours as needed for mild pain.   aspirin EC 325 MG tablet Take 1 tablet (325 mg total) by mouth 2 (two) times daily. What changed:  medication strength  how much to take  when to take this   lisinopril-hydrochlorothiazide 20-12.5 MG tablet Commonly known as:  PRINZIDE,ZESTORETIC Take 1 tablet by mouth every morning.   methocarbamol 500 MG tablet Commonly known as:  ROBAXIN Take 1-2 tablets (500-1,000 mg total) by mouth every 6 (six) hours as needed for muscle spasms.   nitroGLYCERIN 0.4 MG SL tablet Commonly known as:  NITROSTAT Place 1 tablet (0.4  mg total) under the tongue every 5 (five) minutes as needed for chest pain.   oxyCODONE 5 MG immediate release tablet Commonly known as:  Oxy IR/ROXICODONE Take 1 tablet (5 mg total) by mouth every 4 (four) hours as needed for severe pain.   polyethylene glycol packet Commonly known as:  MIRALAX / GLYCOLAX Take 17 g by mouth 2 (two) times daily.       FOLLOW UP VISIT:    DISPOSITION: HOME VS. SNF  CONDITION:  Good   Guy Sandifer 07/02/2016, 7:14 PM

## 2016-07-03 NOTE — Care Management (Signed)
Case manager canceled [facility], per [place], [place], patient will go to outpatient therapy.

## 2016-07-03 NOTE — Progress Notes (Signed)
Pt stable for d/c home today per MD. Met PT goals, has needed equipment at home. Pt reporting discomfort from ace wrap. [place] PA to clarify dressing change orders-stated incisions can be open in the shower and then covered with dry dressing. Ace wrap removed and mepilexes applied to all incisions. Pt aware of his instructions. Discharge instructions and prescriptions reviewed with pt, all questions answered. Pt waiting for transportation home via son.

## 2016-07-03 NOTE — Progress Notes (Signed)
Pt complaining of discomfort to the skin beneath ace dressing.  Pt educated about keeping dressing on that right leg as per verbal orders from PA. Notified PA. Will continue to monitor.

## 2016-07-03 NOTE — Care Management Note (Signed)
Case Management Note  Patient Details  Name: Francisca DecemberMarilyn G Lyn MRN: [id number] Date of Birth: [birth year]  Subjective/Objective:    65 yr old female s/p IM Nailing of right femur fracture.                 Action/Plan: case manager spoke with patient concerning Home health and DME needs. Patient was offered choice for Home Health agency, she states [facility]. Referral was called to Ayesha RumpfMary Yonjof, [facility] Patient states she has rolling walker and 3in1. Will have fam,ily support at discharge.    Expected Discharge Date:07/03/16              Expected Discharge Plan:  Home w Home Health Services  In-House Referral:  NA  Discharge planning Services  CM Consult  Post Acute Care Choice:  Home Health Choice offered to:  Patient  DME Arranged:  N/A DME Agency:  NA  HH Arranged:  PT HH [facility]  [facility]  Status of Service:  Completed, signed off  If discussed at [employer] Meetings, dates discussed:    Additional Comments:  Durenda GuthrieBrady, Derrill Bagnell Naomi, RN 07/03/2016, 2:46 PM
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
## 2016-07-04 NOTE — Op Note (Signed)
Dictation Number: [id number]

## 2016-07-05 NOTE — Consult Note (Signed)
NAME: Sherry Gutierrez MRN:   [id number] DOB:   12/07/1950   CHIEF COMPLAINT:  Right thigh pain  HISTORY:   Sherry PimpleMarilyn G Brooksis a 65 y.o. female  with right  Knee Pain Patient presents with a knee injury involving the right knee. Onset of the symptoms was several days ago. Inciting event: fall. Current symptoms include giving out. Pain is aggravated by any weight bearing. Patient has had prior knee problems. Evaluation to date: plain films: abnormal displaced femoral shaft fracture. Treatment to date: none.    PAST MEDICAL HISTORY:  Past Medical History:  Diagnosis Date  . Arthritis    OA LEFT KNEE  . Hypertension     PAST SURGICAL HISTORY:   Past Surgical History:  Procedure Laterality Date  . BREAST REDUCTION SURGERY    . EXTERNAL FIXATION LEG Right 07/29/2014  . EXTERNAL FIXATION LEG Right 07/29/2014   Procedure: EXTERNAL FIXATION LEG;  Surgeon: Sheral Apleyimothy D Murphy, MD;  Location: [facility] OR;  Service: Orthopedics;  Laterality: Right;  . EXTERNAL FIXATION REMOVAL Right 08/10/2014   Procedure: REMOVAL EXTERNAL FIXATION LEG;  Surgeon: Sheral Apleyimothy D Murphy, MD;  Location: [facility] OR;  Service: Orthopedics;  Laterality: Right;  . FEMUR IM NAIL Right 07/01/2016   Procedure: INTRAMEDULLARY (IM) RETROGRADE FEMORAL NAILING;  Surgeon: Dannielle HuhSteve Lydiana Milley, MD;  Location: [facility] OR;  Service: Orthopedics;  Laterality: Right;  . INCISION AND DRAINAGE OF WOUND Right 08/10/2014   Procedure: IRRIGATION AND DEBRIDEMENT WOUND;  Surgeon: Sheral Apleyimothy D Murphy, MD;  Location: [facility] OR;  Service: Orthopedics;  Laterality: Right;  . ORIF ANKLE FRACTURE Right 08/10/2014   Procedure: OPEN REDUCTION INTERNAL FIXATION (ORIF) ANKLE FRACTURE;  Surgeon: Sheral Apleyimothy D Murphy, MD;  Location: [facility];  Service: Orthopedics;  Laterality: Right;  Bimaleolar  . ORIF TIBIA PLATEAU Right 08/10/2014   Procedure: OPEN REDUCTION INTERNAL FIXATION (ORIF) TIBIAL PLATEAU;  Surgeon: Sheral Apleyimothy D Murphy, MD;  Location: [facility];  Service: Orthopedics;  Laterality: Right;  . ORIF  TOE FRACTURE Left 08/10/2014   Procedure: OPEN REDUCTION INTERNAL FIXATION (ORIF) METATARSAL (TOE) FRACTURE;  Surgeon: Sheral Apleyimothy D Murphy, MD;  Location: [facility] OR;  Service: Orthopedics;  Laterality: Left;  . SYNDESMOSIS REPAIR Right 08/10/2014   Procedure: ORIF SYNDESMOSIS ANKLE;  Surgeon: Sheral Apleyimothy D Murphy, MD;  Location[facility];  Service: Orthopedics;  Laterality: Right;  . TONSILLECTOMY     AGE 53  . TOTAL KNEE ARTHROPLASTY Left 01/18/2014   Procedure: LEFT TOTAL KNEE ARTHROPLASTY;  Surgeon: Shelda PalMatthew D Olin, MD;  Location: [facility];  Service: Orthopedics;  Laterality: Left;    MEDICATIONS:   No prescriptions prior to admission.    ALLERGIES:  No Known Allergies  REVIEW OF SYSTEMS:   Negative except musculoskeletal which is positive for right leg pain  FAMILY HISTORY:   Family History  Problem Relation Age of Onset  . Heart attack Father   . Diabetes Mellitus I Father   . Heart attack Brother     SOCIAL HISTORY:   reports that she has quit smoking. She has never used smokeless tobacco. She reports that she drinks alcohol. She reports that she does not use drugs.  PHYSICAL EXAM:  General appearance: alert, cooperative and no distress Resp: clear to auscultation bilaterally Cardio: regular rate and rhythm, S1, S2 normal, no murmur, click, rub or gallop Extremities: right leg shortening with deformity    LABORATORY STUDIES: No results for input(s): WBC, HGB, HCT, PLT in the last 72 hours.  No results for input(s): NA, K, CL, CO2, GLUCOSE,  BUN, CREATININE, CALCIUM in the last 72 hours.  STUDIES/RESULTS:  Dg Pelvis 1-2 Views  Result Date: 06/29/2016 CLINICAL DATA:  Fall, right leg pain EXAM: PELVIS - 1-2 VIEW COMPARISON:  None. FINDINGS: Visualized bony pelvis appears intact. Bilateral hip joint spaces are preserved. Mild degenerative changes of the lower lumbar spine. IMPRESSION: Negative. Electronically Signed   By: Charline BillsSriyesh  Krishnan M.D.   On: 06/29/2016 18:30   Dg Knee 1-2 Views  Right  Result Date: 06/29/2016 CLINICAL DATA:  Right leg pain after fall EXAM: RIGHT KNEE - 1-2 VIEW COMPARISON:  08/25/2014 FINDINGS: There is an acute, closed, comminuted fracture of the distal femur at the junction of the middle and distal third. No intra-articular extension of the fracture is apparent. There is dorsal and lateral displacement of the distal fracture fragment by 1 shaft width dorsally and 1/2 shaft width laterally. Slight dorsal angulation of the distal fracture fragment relative to the proximal fragment. Lateral plate and screw fixation of the proximal tibia is again noted with intact appearing hardware. There is osteoarthritis of the knee joint. IMPRESSION: There is an acute, closed, comminuted fracture of the distal femur at the junction of the middle and distal third. No intra-articular extension of the fracture is apparent. There is dorsal and lateral displacement of the distal fracture fragment by 1 shaft width dorsally and 1/2 shaft width laterally. Electronically Signed   By: Tollie Ethavid  Kwon M.D.   On: 06/29/2016 18:32   Dg Tibia/fibula Right  Result Date: 06/29/2016 CLINICAL DATA:  Right leg pain after fall.  Initial encounter. EXAM: RIGHT TIBIA AND FIBULA - 2 VIEW COMPARISON:  08/25/2014 FINDINGS: Partly seen distal femur fracture, described separately. Previous bimalleolar and syndesmotic repair with prominent posttraumatic osteoarthritis of the tibiotalar joint. Previous tibial plateau fracture with ORIF that appears healed. Limited visualization due to positioning. Knee osteoarthritis. IMPRESSION: 1. Distal femur fracture is incompletely seen and described separately. 2. Previous tibial plateau and ankle fracture status post ORIF. Limited positioning of the tibia and fibula without suspected acute abnormality. 3. 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 Electronically Signed   By: Marnee SpringJonathon  Watts M.D.   On: 06/29/2016 18:29   Dg Foot Complete Right  Result Date:  06/29/2016 CLINICAL DATA:  Pain after fall. EXAM: RIGHT FOOT COMPLETE - 3+ VIEW COMPARISON:  None. FINDINGS: There is a hallux valgus deformity with significant degenerative changes at the first MTP joint. Screws and plates associated with the distal fibula and tibia consistent with previous surgery. No acute fractures within the foot. IMPRESSION: No acute fractures within the foot. Electronically Signed   By: Gerome Samavid  Williams III M.D   On: 06/29/2016 18:31   Dg C-arm 1-60 Min  Result Date: 07/01/2016 CLINICAL DATA:  Fracture fixation. EXAM: DG C-ARM 61-120 MIN; RIGHT FEMUR 2 VIEWS COMPARISON:  06/29/2016 FINDINGS: There has been placement of a right femoral intramedullary nail bridging patient's distal femoral fracture with hardware intact and anatomic alignment over the fracture site. There are degenerative changes of the knee. Existing hardware unchanged over the proximal tibia. IMPRESSION: Fixation of distal femoral fracture with hardware intact and anatomic alignment over the fracture site. Electronically Signed   By: Elberta Fortisaniel  Boyle M.D.   On: 07/01/2016 10:02   Dg Femur, Min 2 Views Right  Result Date: 07/01/2016 CLINICAL DATA:  Fracture fixation. EXAM: DG C-ARM 61-120 MIN; RIGHT FEMUR 2 VIEWS COMPARISON:  06/29/2016 FINDINGS: There has been placement of a right femoral intramedullary nail bridging patient's distal femoral fracture with hardware intact  and anatomic alignment over the fracture site. There are degenerative changes of the knee. Existing hardware unchanged over the proximal tibia. IMPRESSION: Fixation of distal femoral fracture with hardware intact and anatomic alignment over the fracture site. Electronically Signed   By: Elberta Fortis M.D.   On: 07/01/2016 10:02   Dg Femur Min 2 Views Right  Result Date: 06/29/2016 CLINICAL DATA:  Fall, right leg pain EXAM: RIGHT FEMUR 2 VIEWS COMPARISON:  None. FINDINGS: Comminuted fracture of the distal femoral shaft with posterior displacement of  the distal fracture fragment. Fracture extends inferiorly into the femoral condyles. Intra-articular extension is difficult to exclude, as evaluation is limited by obliquity/difficulty with patient positioning. Status post ORIF of the proximal tibia related to prior fracture. IMPRESSION: Comminuted fracture of the distal femoral shaft, with associated posterior displacement, as above. Electronically Signed   By: Charline Bills M.D.   On: 06/29/2016 18:32    ASSESSMENT: right displaced femur fracture  PLAN: retrograde IM fem oral nail in the OR then follow up in the office   Sallie Maker,STEPHEN D 07/05/2016. 1:24 PM

## 2016-07-05 NOTE — Op Note (Signed)
NAME:  Sherry Gutierrez, Sherry Gutierrez                   ACCOUNT NO.:  MEDICAL RECORD NO.:  [id number]  LOCATION:                                 FACILITY:  PHYSICIAN:  Mila HomerStephen D. Sherlean FootLucey, M.D. DATE OF BIRTH:  07/01/1951  DATE OF PROCEDURE:  07/01/2016 DATE OF DISCHARGE:                              OPERATIVE REPORT   SURGEON:  Mila HomerStephen D. Sherlean FootLucey, M.D.  ASSISTANT:  Tyrone Appleolby Robin, PA-C.  ANESTHESIA:  General.  PREOPERATIVE DIAGNOSIS:  Right distal femoral shaft fracture.  POSTOPERATIVE DIAGNOSIS:  Right distal femoral shaft fracture.  PROCEDURE:  Retrograde nail of the femur.  INDICATION FOR PROCEDURE:  The patient is a 65 year old African American female, who fell from the snow, felt and heard a pop, and was taken by ambulance to the emergency room.  Informed consent was obtained for surgery.  She was placed on the surgical schedule the next available time.  DESCRIPTION OF PROCEDURE:  The patient was taken to the operating room, administered general anesthesia and placed supine on the operating room table.  The entire right leg was prepped and draped in usual fashion.  I put a triangle under the knee with the hip at a 45 degrees angle.  I made a paramedian incision from the inferior pole of the patella to the tibial tubercle approximately 3 cm in length.  I removed the retropatellar fat pad on the medial side, gained access to the intercondylar notch.  I used a guidewire, placed at the apex of the notch verifying on the AP and lateral C-arm images.  I then over reamed with a starter reamer.  I then sequentially reamed up to 12 mm, measured for a 34 cm nail.  I then placed the nail up the femur making sure the fracture was reduced in the AP and lateral C-arm images.  I then filled all 4 distal holes through the outrigger device making puncture wounds with a #15 blade and then placing bicortical screws.  I then used the perfect circle technique proximally to place a single locking screw in the  nail just below the lesser trochanter.  I then verified that the fracture was out to length and reduced on both AP and lateral C-arm images.  I then closed the small incisions with staples, the larger incision with 0 Vicryl, 2-0 Vicryl, and staples.  I did irrigate all wounds copiously prior to closure.  I dressed with Xeroform, dressing sponges, sterile Webril, and Ace wraps.  COMPLICATIONS:  None.  DRAINS:  None.  EBL:  Minimal.          ______________________________ Mila HomerStephen D. Sherlean FootLucey, M.D.     SDL/MEDQ  D:  07/04/2016  T:  07/05/2016  Job:  [id number]

## 2018-03-21 ENCOUNTER — Other Ambulatory Visit: Payer: Self-pay | Admitting: Internal Medicine

## 2018-03-21 DIAGNOSIS — N63 Unspecified lump in unspecified breast: Secondary | ICD-10-CM

## 2018-03-27 ENCOUNTER — Other Ambulatory Visit: Payer: Self-pay | Admitting: Internal Medicine

## 2018-03-27 DIAGNOSIS — N63 Unspecified lump in unspecified breast: Secondary | ICD-10-CM

## 2018-04-03 ENCOUNTER — Ambulatory Visit
Admission: RE | Admit: 2018-04-03 | Discharge: 2018-04-03 | Disposition: A | Discharge status: Home / Self Care | Payer: Medicare Other | Source: Ambulatory Visit | Attending: *Deleted | Admitting: *Deleted

## 2018-04-03 ENCOUNTER — Ambulatory Visit
Admission: RE | Admit: 2018-04-03 | Discharge: 2018-04-03 | Disposition: A | Discharge status: Home / Self Care | Payer: BC Managed Care – PPO | Source: Ambulatory Visit | Attending: *Deleted | Admitting: *Deleted

## 2018-04-03 DIAGNOSIS — N63 Unspecified lump in unspecified breast: Secondary | ICD-10-CM

## 2020-07-23 ENCOUNTER — Other Ambulatory Visit: Payer: Self-pay

## 2020-07-23 DIAGNOSIS — R110 Nausea: Secondary | ICD-10-CM | POA: Diagnosis not present

## 2020-07-23 DIAGNOSIS — Z79899 Other long term (current) drug therapy: Secondary | ICD-10-CM | POA: Diagnosis not present

## 2020-07-23 DIAGNOSIS — R52 Pain, unspecified: Secondary | ICD-10-CM | POA: Diagnosis not present

## 2020-07-23 DIAGNOSIS — I10 Essential (primary) hypertension: Secondary | ICD-10-CM | POA: Diagnosis not present

## 2020-07-23 DIAGNOSIS — Z7982 Long term (current) use of aspirin: Secondary | ICD-10-CM | POA: Diagnosis not present

## 2020-07-23 DIAGNOSIS — R509 Fever, unspecified: Secondary | ICD-10-CM | POA: Diagnosis present

## 2020-07-23 DIAGNOSIS — R059 Cough, unspecified: Secondary | ICD-10-CM | POA: Insufficient documentation

## 2020-07-23 DIAGNOSIS — Z20822 Contact with and (suspected) exposure to covid-19: Secondary | ICD-10-CM | POA: Diagnosis not present

## 2020-07-23 DIAGNOSIS — Z87891 Personal history of nicotine dependence: Secondary | ICD-10-CM | POA: Insufficient documentation

## 2020-07-24 ENCOUNTER — Emergency Department (HOSPITAL_COMMUNITY)
Admission: EM | Admit: 2020-07-24 | Discharge: 2020-07-24 | Disposition: A | Discharge status: Home / Self Care | Payer: Medicare Other | Attending: Emergency Medicine | Admitting: Emergency Medicine

## 2020-07-24 ENCOUNTER — Encounter (HOSPITAL_COMMUNITY): Payer: Self-pay | Admitting: Emergency Medicine

## 2020-07-24 ENCOUNTER — Emergency Department (HOSPITAL_COMMUNITY): Payer: Medicare Other

## 2020-07-24 DIAGNOSIS — R509 Fever, unspecified: Secondary | ICD-10-CM | POA: Diagnosis not present

## 2020-07-24 DIAGNOSIS — B349 Viral infection, unspecified: Secondary | ICD-10-CM

## 2020-07-24 LAB — RESP PANEL BY RT-PCR (FLU A&B, COVID) ARPGX2
Influenza A by PCR: NEGATIVE
Influenza B by PCR: NEGATIVE
SARS Coronavirus 2 by RT PCR: POSITIVE — AB

## 2020-07-24 LAB — POC SARS CORONAVIRUS 2 AG -  ED: SARS Coronavirus 2 Ag: NEGATIVE

## 2020-07-24 MED ORDER — ONDANSETRON 4 MG PO TBDP: 4.0000 mg | ORAL_TABLET | Freq: Three times a day (TID) | ORAL | 0 refills | Status: DC | PRN

## 2020-07-24 MED ORDER — ACETAMINOPHEN 500 MG PO TABS
1000.0000 mg | ORAL_TABLET | Freq: Once | ORAL | Status: AC
  Administered 2020-07-24: 1000 mg via ORAL
  Filled 2020-07-24: qty 2

## 2020-07-24 MED ORDER — ONDANSETRON 4 MG PO TBDP
4.0000 mg | ORAL_TABLET | Freq: Once | ORAL | Status: AC
  Administered 2020-07-24: 4 mg via ORAL
  Filled 2020-07-24: qty 1

## 2020-07-24 NOTE — ED Triage Notes (Signed)
Pt arriving POV with complaints of headache, generalized weakness, and body aches that began earlier today. Recently traveled to [place] last week.

## 2020-07-24 NOTE — Discharge Instructions (Signed)
IF YOUR COVID TEST IS POSITIVE:  Please quarantine for 10 days after the onset of symptoms.  You will need to be fever free without using Tylenol or Ibuprofen for 3 full days AND your symptoms will need to be significantly improving or resolved (other than the loss of taste and smell which can last up to 3 months) before you can come out of quarantine.  You should isolate from others that you live with as well.  Any close contacts that have seen you in the past 2 days before your symptoms have started will need to be notified and they will need to quarantine for 14 full days even if they have a negative COVID test.  COVID-19 is a viral illness and currently there are no specific outpatient treatments other than supportive measures such as alternating Tylenol and Ibuprofen, rest, increased fluid intake.  You do not need antibiotics.  If you develop chest pain, difficulty breathing, have blue lips or fingertips, begin vomiting and can not stop and can not hold down fluids, feel like you may pass out or you do pass out, have confusion, please return to the emergency department.  You may alternate Tylenol 1000 mg every 6 hours as needed for pain, fever and Ibuprofen 800 mg every 8 hours as needed for pain, fever.  Please take Ibuprofen with food.  Do not take more than 4000 mg of Tylenol (acetaminophen) in a 24 hour period.

## 2020-07-24 NOTE — ED Provider Notes (Addendum)
TIME SEEN: 12:53 AM  CHIEF COMPLAINT: Body aches, fever, cough, nausea  HPI: [name] with history of hypertension who presents to the emergency department with 1 day of body aches, fever, cough, nausea.  No vomiting or diarrhea.  No sore throat.  No chest pain or shortness of breath.  She has not been vaccinated for COVID-19 or influenza.  Did not take any medications prior to arrival.  ROS: See HPI Constitutional:  fever  Eyes: no drainage  ENT: no runny nose   Cardiovascular:  no chest pain  Resp: no SOB  GI: no vomiting GU: no dysuria Integumentary: no rash  Allergy: no hives  Musculoskeletal: no leg swelling  Neurological: no slurred speech ROS otherwise negative  PAST MEDICAL HISTORY/PAST SURGICAL HISTORY:  Past Medical History:  Diagnosis Date  . Arthritis    OA LEFT KNEE  . Hypertension     MEDICATIONS:  Prior to Admission medications   Medication Sig Start Date End Date Taking? Authorizing Provider  acetaminophen (TYLENOL) 325 MG tablet Take 1-2 tablets (325-650 mg total) by mouth every 4 (four) hours as needed for mild pain. Patient not taking: Reported on 06/29/2016 08/25/14   Love, Evlyn Kanner, PA-C  aspirin EC 325 MG tablet Take 1 tablet (325 mg total) by mouth 2 (two) times daily. 07/02/16   Guy Sandifer, PA  lisinopril-hydrochlorothiazide (PRINZIDE,ZESTORETIC) 20-12.5 MG tablet Take 1 tablet by mouth every morning.  12/20/15   [provider]  methocarbamol (ROBAXIN) 500 MG tablet Take 1-2 tablets (500-1,000 mg total) by mouth every 6 (six) hours as needed for muscle spasms. 07/02/16   Guy Sandifer, PA  nitroGLYCERIN (NITROSTAT) 0.4 MG SL tablet Place 1 tablet (0.4 mg total) under the tongue every 5 (five) minutes as needed for chest pain. 02/14/16   Rai, Ripudeep K, MD  oxyCODONE (OXY IR/ROXICODONE) 5 MG immediate release tablet Take 1 tablet (5 mg total) by mouth every 4 (four) hours as needed for severe pain. 07/02/16    Guy Sandifer, PA  polyethylene glycol [facility]) packet Take 17 g by mouth 2 (two) times daily. Patient not taking: Reported on 06/29/2016 01/19/14   Lanney Gins, PA-C    ALLERGIES:  No Known Allergies  SOCIAL HISTORY:  Social History   Tobacco Use  . Smoking status: Former Games developer  . Smokeless tobacco: Never Used  . Tobacco comment: QUIT SMOKING IN 1982  Substance Use Topics  . Alcohol use: Yes    Comment: QUIT SMOKING 1982  OCCAS GLASS OF WINE    FAMILY HISTORY: Family History  Problem Relation Age of Onset  . Heart attack Father   . Diabetes Mellitus I Father   . Heart attack Brother   . Breast cancer Sister 74    EXAM: BP (!) 161/94 (BP Location: Right Arm)   Pulse 86   Temp 100.3 F (37.9 C) (Oral)   Resp 16   Ht 5\' 5"  (1.651 m)   Wt 94.3 kg   SpO2 100%   BMI 34.61 kg/m  CONSTITUTIONAL: Alert and oriented and responds appropriately to questions. Well-appearing; well-nourished HEAD: Normocephalic EYES: Conjunctivae clear, pupils appear equal, EOM appear intact ENT: normal nose; moist mucous membranes NECK: Supple, normal ROM CARD: RRR; S1 and S2 appreciated; no murmurs, no clicks, no rubs, no gallops RESP: Normal chest excursion without splinting or tachypnea; breath sounds clear and equal bilaterally; no wheezes, no rhonchi, no rales, no hypoxia or respiratory distress, speaking full sentences ABD/GI:  Normal bowel sounds; non-distended; soft, non-tender, no rebound, no guarding, no peritoneal signs, no hepatosplenomegaly BACK:  The back appears normal EXT: Normal ROM in all joints; no deformity noted, no edema; no cyanosis SKIN: Normal color for age and race; warm; no rash on exposed skin NEURO: Moves all extremities equally PSYCH: [name] mood and manner are appropriate.   MEDICAL DECISION MAKING: Patient here with symptoms of viral illness.  Her rapid Covid test has been negative.  We will send PCR Covid and flu test.  Chest x-ray  clear.  No hypoxia or respiratory distress.  Anticipate discharge home.  She does not meet criteria for monoclonal antibody infusion given significant supply limitations.  Discussed return precautions and supportive care instructions at home.  At this time, I do not feel there is any life-threatening condition present. I have reviewed, interpreted and discussed all results (EKG, imaging, lab, urine as appropriate) and exam findings with patient/family. I have reviewed nursing notes and appropriate previous records.  I feel the patient is safe to be discharged home without further emergent workup and can continue workup as an outpatient as needed. Discussed usual and customary return precautions. Patient/family verbalize understanding and are comfortable with this plan.  Outpatient follow-up has been provided as needed. All questions have been answered.    Sherry Gutierrez was evaluated in Emergency Department on 07/24/2020 for the symptoms described in the history of present illness. She was evaluated in the context of the global COVID-19 pandemic, which necessitated consideration that the patient might be at risk for infection with the SARS-CoV-2 virus that causes COVID-19. Institutional protocols and algorithms that pertain to the evaluation of patients at risk for COVID-19 are in a state of rapid change based on information released by regulatory bodies including the CDC and federal and state organizations. These policies and algorithms were followed during the patient's care in the ED.      Maanav Kassabian, Layla Maw, DO 07/24/20 0228    7:29 AM  Pt's COVID test is positive.  Left message for patient.  She had already been given instructions regarding supportive care instructions, return precautions and importance of isolation/quarantine.   Falan Hensler, Layla Maw, DO 07/24/20 0730

## 2020-07-25 ENCOUNTER — Other Ambulatory Visit: Payer: Self-pay | Admitting: Nurse Practitioner

## 2020-07-25 ENCOUNTER — Telehealth (HOSPITAL_COMMUNITY): Payer: Self-pay

## 2020-07-25 DIAGNOSIS — U071 COVID-19: Secondary | ICD-10-CM

## 2020-07-25 NOTE — Progress Notes (Signed)
I connected by phone with Sherry Gutierrez on 07/25/2020 at 12:58 PM to discuss the potential use of a new treatment for mild to moderate COVID-19 viral infection in non-hospitalized patients.  This patient is a 70 y.o. female that meets the FDA criteria for Emergency Use Authorization of COVID monoclonal antibody casirivimab/imdevimab, bamlanivimab/etesevimab, or sotrovimab.  Has a (+) direct SARS-CoV-2 viral test result  Has mild or moderate COVID-19   Is NOT hospitalized due to COVID-19  Is within 10 days of symptom onset  Has at least one of the high risk factor(s) for progression to severe COVID-19 and/or hospitalization as defined in EUA.  Specific high risk criteria : Older age (>/= 68 yo), BMI > 25, Cardiovascular disease or hypertension and Other high risk medical condition per CDC:  NIH Tier 1, SVI 4   I have spoken and communicated the following to the patient or parent/caregiver regarding COVID monoclonal antibody treatment:  1. FDA has authorized the emergency use for the treatment of mild to moderate COVID-19 in adults and pediatric patients with positive results of direct SARS-CoV-2 viral testing who are 63 years of age and older weighing at least 40 kg, and who are at high risk for progressing to severe COVID-19 and/or hospitalization.  2. The significant known and potential risks and benefits of COVID monoclonal antibody, and the extent to which such potential risks and benefits are unknown.  3. Information on available alternative treatments and the risks and benefits of those alternatives, including clinical trials.  4. Patients treated with COVID monoclonal antibody should continue to self-isolate and use infection control measures (e.g., wear mask, isolate, social distance, avoid sharing personal items, clean and disinfect "high touch" surfaces, and frequent handwashing) according to CDC guidelines.   5. The patient or parent/caregiver has the option to accept or refuse  COVID monoclonal antibody treatment.  After reviewing this information with the patient, the patient has agreed to receive one of the available covid 19 monoclonal antibodies and will be provided an appropriate fact sheet prior to infusion. Alinda Dooms, NP 07/25/2020 12:58 PM

## 2020-07-25 NOTE — Telephone Encounter (Signed)
Called to discuss with patient about COVID-19 symptoms and the use of one of the available treatments for those with mild to moderate Covid symptoms and at a high risk of hospitalization.     Pt appears to qualify for this infusion due to co-morbid conditions and/or a member of an at-risk group in accordance with the FDA Emergency Use Authorization.    Symptom onset: 07/23/20. Sx include headache, body aches and fever.   Tested on 07/24/20 at [facility] ED.   Vaccinated: Teacher, adult education for Infusion: HTN, BMI over 25, SVI, and age over 13.    Will need transportation if given appointment. Address in MyChart is up to date.  Angelia Mould

## 2020-07-26 ENCOUNTER — Ambulatory Visit (HOSPITAL_COMMUNITY): Payer: Medicare Other

## 2021-03-03 ENCOUNTER — Other Ambulatory Visit: Payer: Self-pay | Admitting: Family Medicine

## 2021-03-03 DIAGNOSIS — Z1231 Encounter for screening mammogram for malignant neoplasm of breast: Secondary | ICD-10-CM

## 2021-03-13 ENCOUNTER — Other Ambulatory Visit: Payer: Self-pay

## 2021-03-13 ENCOUNTER — Ambulatory Visit
Admission: RE | Admit: 2021-03-13 | Discharge: 2021-03-13 | Disposition: A | Discharge status: Home / Self Care | Payer: Medicare Other | Source: Ambulatory Visit | Attending: Family Medicine | Admitting: Family Medicine

## 2021-03-13 DIAGNOSIS — Z1231 Encounter for screening mammogram for malignant neoplasm of breast: Secondary | ICD-10-CM

## 2022-05-14 ENCOUNTER — Other Ambulatory Visit: Payer: Self-pay | Admitting: Family Medicine

## 2022-05-14 DIAGNOSIS — Z1231 Encounter for screening mammogram for malignant neoplasm of breast: Secondary | ICD-10-CM

## 2022-07-06 ENCOUNTER — Ambulatory Visit
Admission: RE | Admit: 2022-07-06 | Discharge: 2022-07-06 | Disposition: A | Discharge status: Home / Self Care | Payer: Medicare Other | Source: Ambulatory Visit | Attending: Family Medicine | Admitting: Family Medicine

## 2022-07-06 DIAGNOSIS — Z1231 Encounter for screening mammogram for malignant neoplasm of breast: Secondary | ICD-10-CM

## 2022-07-10 ENCOUNTER — Other Ambulatory Visit: Payer: Self-pay | Admitting: Family Medicine

## 2022-07-10 DIAGNOSIS — R928 Other abnormal and inconclusive findings on diagnostic imaging of breast: Secondary | ICD-10-CM

## 2022-07-25 ENCOUNTER — Other Ambulatory Visit: Payer: BC Managed Care – PPO

## 2022-07-30 ENCOUNTER — Ambulatory Visit: Payer: Medicare Other

## 2022-07-30 ENCOUNTER — Ambulatory Visit
Admission: RE | Admit: 2022-07-30 | Discharge: 2022-07-30 | Disposition: A | Discharge status: Home / Self Care | Payer: Medicare Other | Source: Ambulatory Visit | Attending: Family Medicine | Admitting: Family Medicine

## 2022-07-30 DIAGNOSIS — R928 Other abnormal and inconclusive findings on diagnostic imaging of breast: Secondary | ICD-10-CM

## 2022-09-09 LAB — VITAMIN D 25 HYDROXY (VIT D DEFICIENCY, FRACTURES): Vit D, 25-Hydroxy: 56.29

## 2022-09-09 LAB — HEMOGLOBIN A1C: Hemoglobin A1C: 5.7

## 2022-09-09 LAB — TSH: TSH: 1.93 (ref 0.41–5.90)

## 2022-09-17 DIAGNOSIS — Z0289 Encounter for other administrative examinations: Secondary | ICD-10-CM

## 2022-09-27 ENCOUNTER — Encounter (INDEPENDENT_AMBULATORY_CARE_PROVIDER_SITE_OTHER): Payer: Self-pay | Admitting: Family Medicine

## 2022-09-27 ENCOUNTER — Ambulatory Visit (INDEPENDENT_AMBULATORY_CARE_PROVIDER_SITE_OTHER): Payer: Medicare Other | Admitting: Family Medicine

## 2022-09-27 VITALS — BP 114/72 | HR 64 | Temp 97.6°F | Ht 64.0 in | Wt 239.0 lb

## 2022-09-27 DIAGNOSIS — R5383 Other fatigue: Secondary | ICD-10-CM | POA: Diagnosis not present

## 2022-09-27 DIAGNOSIS — I10 Essential (primary) hypertension: Secondary | ICD-10-CM

## 2022-09-27 DIAGNOSIS — F3289 Other specified depressive episodes: Secondary | ICD-10-CM

## 2022-09-27 DIAGNOSIS — R0602 Shortness of breath: Secondary | ICD-10-CM | POA: Diagnosis not present

## 2022-09-27 DIAGNOSIS — E7849 Other hyperlipidemia: Secondary | ICD-10-CM

## 2022-09-27 DIAGNOSIS — E785 Hyperlipidemia, unspecified: Secondary | ICD-10-CM

## 2022-09-27 DIAGNOSIS — E669 Obesity, unspecified: Secondary | ICD-10-CM

## 2022-09-27 DIAGNOSIS — Z6841 Body Mass Index (BMI) 40.0 and over, adult: Secondary | ICD-10-CM

## 2022-09-27 DIAGNOSIS — R739 Hyperglycemia, unspecified: Secondary | ICD-10-CM

## 2022-09-27 DIAGNOSIS — E559 Vitamin D deficiency, unspecified: Secondary | ICD-10-CM

## 2022-09-27 NOTE — Progress Notes (Deleted)
Went to [employer] presentation.  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  She previously worked out with a Clinical research associate up until 4 months ago.  She has now joined [employer].  Has a significant family history of hypertension and diabetes. Desired weight of 200lbs; cannot remember the last time she was 200lbs. Skips breakfast daily due to lack of hunger. Eats mostly vegetarian. Doen't get hungry until like 11am.  Occasionally drinks coffee or tea and uses 2 hazelnut creamer. Cooks in bulk sometimes.  May have collards (2 cups with 1 tsp of sugar) or oatmeal with raisins (2 cups oatmeal with 4 tbsp raisins).  Feels satisfied from these options.  Patient voices that throughout the entire day she is eating mostly vegetables.  She is getting away from meats and may do [place] wings.  Does occasionally eat snack type foods like chips, oreos.  She eats out occasionally.  Eats meat around 20% of the time. Doe at times eat vegetarian type products. Reports a significant frequency of emotional eating.

## 2022-09-28 LAB — COMPREHENSIVE METABOLIC PANEL
ALT: 10 IU/L (ref 0–32)
AST: 12 IU/L (ref 0–40)
Albumin/Globulin Ratio: 1.4 (ref 1.2–2.2)
Albumin: 4.3 g/dL (ref 3.9–4.9)
Alkaline Phosphatase: 76 IU/L (ref 44–121)
BUN/Creatinine Ratio: 16 (ref 12–28)
BUN: 18 mg/dL (ref 8–27)
Bilirubin Total: 0.5 mg/dL (ref 0.0–1.2)
CO2: 25 mmol/L (ref 20–29)
Calcium: 9.7 mg/dL (ref 8.7–10.3)
Chloride: 101 mmol/L (ref 96–106)
Creatinine, Ser: 1.11 mg/dL — ABNORMAL HIGH (ref 0.57–1.00)
Globulin, Total: 3.1 g/dL (ref 1.5–4.5)
Glucose: 100 mg/dL — ABNORMAL HIGH (ref 70–99)
Potassium: 4.6 mmol/L (ref 3.5–5.2)
Sodium: 143 mmol/L (ref 134–144)
Total Protein: 7.4 g/dL (ref 6.0–8.5)
eGFR: 53 mL/min/{1.73_m2} — ABNORMAL LOW (ref >59)

## 2022-09-28 LAB — LIPID PANEL WITH LDL/HDL RATIO
Cholesterol, Total: 169 mg/dL (ref 100–199)
HDL: 66 mg/dL (ref >39)
LDL Chol Calc (NIH): 88 mg/dL (ref 0–99)
LDL/HDL Ratio: 1.3 ratio (ref 0.0–3.2)
Triglycerides: 78 mg/dL (ref 0–149)
VLDL Cholesterol Cal: 15 mg/dL (ref 5–40)

## 2022-09-28 LAB — CBC WITH DIFFERENTIAL/PLATELET
Basophils Absolute: 0 10*3/uL (ref 0.0–0.2)
Basos: 1 %
EOS (ABSOLUTE): 0.3 10*3/uL (ref 0.0–0.4)
Eos: 6 %
Hematocrit: 42.5 % (ref 34.0–46.6)
Hemoglobin: 13.3 g/dL (ref 11.1–15.9)
Immature Grans (Abs): 0 10*3/uL (ref 0.0–0.1)
Immature Granulocytes: 0 %
Lymphocytes Absolute: 1.5 10*3/uL (ref 0.7–3.1)
Lymphs: 31 %
MCH: 27.8 pg (ref 26.6–33.0)
MCHC: 31.3 g/dL — ABNORMAL LOW (ref 31.5–35.7)
MCV: 89 fL (ref 79–97)
Monocytes Absolute: 0.4 10*3/uL (ref 0.1–0.9)
Monocytes: 9 %
Neutrophils Absolute: 2.6 10*3/uL (ref 1.4–7.0)
Neutrophils: 53 %
Platelets: 334 10*3/uL (ref 150–450)
RBC: 4.78 x10E6/uL (ref 3.77–5.28)
RDW: 12.7 % (ref 11.7–15.4)
WBC: 4.9 10*3/uL (ref 3.4–10.8)

## 2022-09-28 LAB — INSULIN, RANDOM: INSULIN: 14.2 u[IU]/mL (ref 2.6–24.9)

## 2022-10-08 NOTE — Progress Notes (Signed)
Chief Complaint:   OBESITY Sherry Gutierrez (MR# [id number]) is a 72 y.o. female who presents for evaluation and treatment of obesity and related comorbidities. Current BMI is Body mass index is 41.02 kg/m. Sherry Gutierrez has been struggling with her weight for many years and has been unsuccessful in either losing weight, maintaining weight loss, or reaching her healthy weight goal.  Sherry Gutierrez to [facility] presentation.  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  She previously worked out with a Clinical research associate up until 4 months ago.  She has now joined [employer].  Has a significant family history of hypertension and diabetes. Desired weight of 200lbs; cannot remember the last time she was 200lbs. Skips breakfast daily due to lack of hunger. Eats mostly vegetarian. Doen't get hungry until like 11am.  Occasionally drinks coffee or tea and uses 2 hazelnut creamer. Cooks in bulk sometimes.  May have collards (2 cups with 1 tsp of sugar) or oatmeal with raisins (2 cups oatmeal with 4 tbsp raisins).  Feels satisfied from these options.  Patient voices that throughout the entire day she is eating mostly vegetables.  She is getting away from meats and may do [place] wings.  Does occasionally eat snack type foods like chips, oreos.  She eats out occasionally.  Eats meat around 20% of the time. Doe at times eat vegetarian type products. Reports a significant frequency of emotional eating.     Sherry Gutierrez is currently in the action stage of change and ready to dedicate time achieving and maintaining a healthier weight. Sherry Gutierrez is interested in becoming our patient and working on intensive lifestyle modifications including (but not limited to) diet and exercise for weight loss.  Sherry Gutierrez's habits were reviewed today and are as follows: her desired weight loss is 39 lbs, she started gaining weight in 2022, her heaviest weight ever was 245 pounds, she has significant food cravings issues, she snacks frequently  in the evenings, she skips meals frequently, she is trying to follow a vegetarian diet, she is frequently drinking liquids with calories, she frequently makes poor food choices, she frequently eats larger portions than normal, and she struggles with emotional eating.  Depression Screen Sherry Gutierrez Food and Mood (modified PHQ-9) score was 12.  Subjective:   1. Other fatigue Sherry Gutierrez admits to daytime somnolence and admits to waking up still tired. Patient has a history of symptoms of daytime fatigue, morning fatigue, and morning headache. Sherry Gutierrez generally gets 8 hours of sleep per night, and states that she has nightime awakenings. Snoring is present. Apneic episodes are present. Epworth Sleepiness Score is 7.  EKG done on 3 to 4 months ago at PCP office.  Awaiting faxed EKG results.   2. SOBOE (shortness of breath on exertion) Sherry Gutierrez notes increasing shortness of breath with exercising and seems to be worsening over time with weight gain. She notes getting out of breath sooner with activity than she used to. This has not gotten worse recently. Sherry Gutierrez denies shortness of breath at rest or orthopnea.  3. Hyperglycemia Patient has had elevated blood sugars in the past.  Last A1c was 5.7 in February 2024.  4. Essential hypertension Blood pressure controlled today.  Patient is on lisinopril/HCTZ, 20/12.5 mg.  Patient denies chest pain, chest pressure, headache.  Patient was diagnosed about 20 to 25 years ago.  5. Other hyperlipidemia This is a historical diagnosis.  No current issue.  Patient is not on any medications.  6. Vitamin D deficiency Patient  is on OTC vitamin D.  Patient complains of fatigue.  7. Other depression with emotional eating Patient reports that she does recognize the significant frequency of emotional eating, particularly after her husband's sudden death in 10/31/2003.    Assessment/Plan:   1. Other fatigue Sherry Gutierrez does feel that her weight is causing her energy to be lower  than it should be. Fatigue may be related to obesity, depression or many other causes. Labs will be ordered, and in the meanwhile, Sherry Gutierrez will focus on self care including making healthy food choices, increasing physical activity and focusing on stress reduction.  Check IC and labs today.  - CBC with Differential/Platelet  2. SOBOE (shortness of breath on exertion) Sherry Gutierrez does feel that she gets out of breath more easily that she used to when she exercises. Sherry Gutierrez's shortness of breath appears to be obesity related and exercise induced. She has agreed to work on weight loss and gradually increase exercise to treat her exercise induced shortness of breath. Will continue to monitor closely.  3. Hyperglycemia Check labs today.  - Insulin, random  4. Essential hypertension Check labs today.  - Comprehensive metabolic panel  5. Other hyperlipidemia Check labs today.  - Lipid Panel With LDL/HDL Ratio  6. Vitamin D deficiency Check labs today.  7. Other depression with emotional eating Sherry Gutierrez had a positive depression screening. Depression is commonly associated with obesity and often results in emotional eating behaviors. We will monitor this closely and work on CBT to help improve the non-hunger eating patterns. Referral to Psychology may be required if no improvement is seen as she continues in our clinic. Refer to Dr Mallie Mussel.   8. BMI 40.0-44.9, adult (Sherry Gutierrez)  9. Obesity with starting BMI of 41.0 Sherry Gutierrez is currently in the action stage of change and her goal is to continue with weight loss efforts. I recommend Sherry Gutierrez begin the structured treatment plan as follows:  She has agreed to the [place].  Exercise goals:  As is.     Behavioral modification strategies: increasing lean protein intake, meal planning and cooking strategies, keeping healthy foods in the home, and planning for success.  She was informed of the importance of frequent follow-up visits to maximize her  success with intensive lifestyle modifications for her multiple health conditions. She was informed we would discuss her lab results at her next visit unless there is a critical issue that needs to be addressed sooner. Sherry Gutierrez agreed to keep her next visit at the agreed upon time to discuss these results.  Objective:   Blood pressure 114/72, pulse 64, temperature 97.6 F (36.4 C), height 5\' 4"  (1.626 m), weight 239 lb (108.4 kg), SpO2 98 %. Body mass index is 41.02 kg/m.  EKG: Normal sinus rhythm, rate awaiting results from PCP.  Indirect Calorimeter completed today shows a VO2 of 226 and a REE of 1555.  Her calculated basal metabolic rate is 123456 thus her basal metabolic rate is worse than expected.  General: Cooperative, alert, well developed, in no acute distress. HEENT: Conjunctivae and lids unremarkable. Cardiovascular: Regular rhythm.  Lungs: Normal work of breathing. Neurologic: No focal deficits.   Lab Results  Component Value Date   CREATININE 1.11 (H) 09/27/2022   BUN 18 09/27/2022   NA 143 09/27/2022   K 4.6 09/27/2022   CL 101 09/27/2022   CO2 25 09/27/2022   Lab Results  Component Value Date   ALT 10 09/27/2022   AST 12 09/27/2022   ALKPHOS 76 09/27/2022  BILITOT 0.5 09/27/2022   Lab Results  Component Value Date   HGBA1C 5.7 09/09/2022   HGBA1C 5.5 02/14/2016   Lab Results  Component Value Date   INSULIN 14.2 09/27/2022   Lab Results  Component Value Date   TSH 1.93 09/09/2022   Lab Results  Component Value Date   CHOL 169 09/27/2022   HDL 66 09/27/2022   LDLCALC 88 09/27/2022   TRIG 78 09/27/2022   CHOLHDL 2.3 02/14/2016   Lab Results  Component Value Date   WBC 4.9 09/27/2022   HGB 13.3 09/27/2022   HCT 42.5 09/27/2022   MCV 89 09/27/2022   PLT 334 09/27/2022   No results found for: "IRON", "TIBC", "FERRITIN"  Attestation Statements:   Reviewed by clinician on day of visit: allergies, medications, problem list, medical history,  surgical history, family history, social history, and previous encounter notes.  Time spent on visit including pre-visit chart review and post-visit charting and care was 40 minutes.   I, Davy Pique, RMA, am acting as transcriptionist for Coralie Common, MD.  This is the patient's first visit at [facility]. [name] NEW PATIENT PACKET was reviewed at length. Included in the packet: current and past health history, medications, allergies, ROS, gynecologic history (women only), surgical history, family history, social history, weight history, weight loss surgery history (for those that have had weight loss surgery), nutritional evaluation, mood and food questionnaire, PHQ9, Epworth questionnaire, sleep habits questionnaire, patient life and health improvement goals questionnaire. These will all be scanned into the patient's chart under media.   During the visit, I independently reviewed the patient's EKG, bioimpedance scale results, and indirect calorimeter results. I used this information to tailor a meal plan for the patient that will help her to lose weight and will improve her obesity-related conditions going forward. I performed a medically necessary appropriate examination and/or evaluation. I discussed the assessment and treatment plan with the patient. The patient was provided an opportunity to ask questions and all were answered. The patient agreed with the plan and demonstrated an understanding of the instructions. Labs were ordered at this visit and will be reviewed at the next visit unless more critical results need to be addressed immediately. Clinical information was updated and documented in the EMR.    I have reviewed the above documentation for accuracy and completeness, and I agree with the above. - Coralie Common, MD

## 2022-10-11 ENCOUNTER — Ambulatory Visit (INDEPENDENT_AMBULATORY_CARE_PROVIDER_SITE_OTHER): Payer: Medicare Other | Admitting: Family Medicine

## 2022-10-11 ENCOUNTER — Encounter (INDEPENDENT_AMBULATORY_CARE_PROVIDER_SITE_OTHER): Payer: Self-pay | Admitting: Family Medicine

## 2022-10-11 VITALS — BP 109/65 | HR 70 | Temp 97.6°F | Ht 64.0 in | Wt 241.0 lb

## 2022-10-11 DIAGNOSIS — Z6841 Body Mass Index (BMI) 40.0 and over, adult: Secondary | ICD-10-CM

## 2022-10-11 DIAGNOSIS — E1159 Type 2 diabetes mellitus with other circulatory complications: Secondary | ICD-10-CM

## 2022-10-11 DIAGNOSIS — R7303 Prediabetes: Secondary | ICD-10-CM

## 2022-10-11 DIAGNOSIS — I10 Essential (primary) hypertension: Secondary | ICD-10-CM | POA: Diagnosis not present

## 2022-10-11 DIAGNOSIS — E559 Vitamin D deficiency, unspecified: Secondary | ICD-10-CM | POA: Diagnosis not present

## 2022-10-11 DIAGNOSIS — E669 Obesity, unspecified: Secondary | ICD-10-CM | POA: Diagnosis not present

## 2022-10-11 NOTE — Progress Notes (Deleted)
Patient returns to clinic for first follow up.  She is really enjoying [facility] and this clinic. She is very motivated to continue to meet her goals.  She is feeling like her awareness of nutrition has improved. She tweaked meal plan because she isn't a big meat eater.  She has moved back into being a vegetarian and has gotten more conscious in her grocery shopping (went to the grocery store yesterday).  Has adapted some of the grocery lists and products on plan.  Slowly incorporating vegetarian products to increase protein content.  Overall she loves fruits and vegetables.  She realizes she may not be getting enough protein in daily.  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

## 2022-10-16 NOTE — Progress Notes (Signed)
Chief Complaint:   OBESITY Sherry Gutierrez is here to discuss her progress with her obesity treatment plan along with follow-up of her obesity related diagnoses. Sherry Gutierrez is on the vegetarian plan and states she is following her eating plan approximately 95% of the time. Sherry Gutierrez states she is going to [place] well for 60 minutes 3 times per week and walking 5000-6000 steps 7 times per week.  Today's visit was #: 2 Starting weight: 239 lbs Starting date: 09/27/22 Today's weight: 241 lbs Today's date: 10/11/22 Total lbs lost to date: 0 Total lbs lost since last in-office visit: +2  Interim History: Patient returns to clinic for first follow up.  She is really enjoying [facility] and this clinic. She is very motivated to continue to meet her goals.  She is feeling like her awareness of nutrition has improved. She tweaked meal plan because she isn't a big meat eater.  She has moved back into being a vegetarian and has gotten more conscious in her grocery shopping (went to the grocery store yesterday).  Has adapted some of the grocery lists and products on plan.  Slowly incorporating vegetarian products to increase protein content.  Overall she loves fruits and vegetables.  She realizes she may not be getting enough protein in daily.  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   Subjective:   1. Hypertension associated with diabetes ([place]) Blood pressure controlled today. No chest pain, chest pressure, headache. On Zestoretic daily.  2. Vitamin D deficiency On 10,000 IU/day. Positive for fatigue.  3. Prediabetes A1c 5.7.  Insulin 14.2. Not on medications.  Not much carb cravings. Previously better controlled A1c when she was working with a trainer previously.  Assessment/Plan:   1. Hypertension associated with diabetes ([place]) Continue Zestoretic daily.  No change in dose daily.  2. Vitamin D deficiency Continue current over-the-counter dosage.  3.  Prediabetes Follow-up labs in 3 months.  No medications at this time. Pathophysiology of insulin resistance, prediabetes, diabetes discussed today.  4. BMI 40.0-44.9, adult ([place])     Obesity with starting BMI of 41.0 Sherry Gutierrez is currently in the action stage of change. As such, her goal is to continue with weight loss efforts. She has agreed to keeping a food journal with goal of 1200 calories and 80 grams of protein daily.  Exercise goals:  as is  Behavioral modification strategies: increasing lean protein intake, meal planning and cooking strategies, keeping healthy foods in the home, planning for success, and keeping a strict food journal.  Sherry Gutierrez has agreed to follow-up with our clinic in 2-3 weeks. She was informed of the importance of frequent follow-up visits to maximize her success with intensive lifestyle modifications for her multiple health conditions.    Objective:   Blood pressure 109/65, pulse 70, temperature 97.6 F (36.4 C), height 5\' 4"  (1.626 m), weight 241 lb (109.3 kg), SpO2 99 %. Body mass index is 41.37 kg/m.  General: Cooperative, alert, well developed, in no acute distress. HEENT: Conjunctivae and lids unremarkable. Cardiovascular: Regular rhythm.  Lungs: Normal work of breathing. Neurologic: No focal deficits.   Lab Results  Component Value Date   CREATININE 1.11 (H) 09/27/2022   BUN 18 09/27/2022   NA 143 09/27/2022   K 4.6 09/27/2022   CL 101 09/27/2022   CO2 25 09/27/2022   Lab Results  Component Value Date   ALT 10 09/27/2022   AST 12 09/27/2022   ALKPHOS 76  09/27/2022   BILITOT 0.5 09/27/2022   Lab Results  Component Value Date   HGBA1C 5.7 09/09/2022   HGBA1C 5.5 02/14/2016   Lab Results  Component Value Date   INSULIN 14.2 09/27/2022   Lab Results  Component Value Date   TSH 1.93 09/09/2022   Lab Results  Component Value Date   CHOL 169 09/27/2022   HDL 66 09/27/2022   LDLCALC 88 09/27/2022   TRIG 78 09/27/2022   CHOLHDL  2.3 02/14/2016   Lab Results  Component Value Date   VD25OH 56.29 09/09/2022   Lab Results  Component Value Date   WBC 4.9 09/27/2022   HGB 13.3 09/27/2022   HCT 42.5 09/27/2022   MCV 89 09/27/2022   PLT 334 09/27/2022   No results found for: "IRON", "TIBC", "FERRITIN"  Attestation Statements:   Reviewed by clinician on day of visit: allergies, medications, problem list, medical history, surgical history, family history, social history, and previous encounter notes.  Time spent on visit including pre-visit chart review and post-visit care and charting was 45 minutes.   I, Dawn Whitmire, FNP-C, am acting as Energy managertranscriptionist for Reuben LikesAlexandria Raeshaun Simson, MD.  I have reviewed the above documentation for accuracy and completeness, and I agree with the above. - Reuben LikesAlexandria Azusena Erlandson, MD

## 2022-11-06 ENCOUNTER — Ambulatory Visit (INDEPENDENT_AMBULATORY_CARE_PROVIDER_SITE_OTHER): Payer: Medicare Other | Admitting: Family Medicine

## 2024-02-03 ENCOUNTER — Other Ambulatory Visit: Payer: Self-pay | Admitting: Family Medicine

## 2024-02-03 DIAGNOSIS — Z0000 Encounter for general adult medical examination without abnormal findings: Secondary | ICD-10-CM

## 2024-02-17 ENCOUNTER — Ambulatory Visit
Admission: RE | Admit: 2024-02-17 | Discharge: 2024-02-17 | Disposition: A | Discharge status: Home / Self Care | Source: Ambulatory Visit | Attending: Family Medicine | Admitting: Family Medicine

## 2024-02-17 DIAGNOSIS — Z0000 Encounter for general adult medical examination without abnormal findings: Secondary | ICD-10-CM
